# Patient Record
Sex: Male | Born: 1980 | Race: White | Hispanic: No | Marital: Single | Smoking: Current every day smoker
Health system: Southern US, Community
[De-identification: ages and names within clinical notes are randomized; demographics above are authoritative.]

## PROBLEM LIST (undated history)

## (undated) DIAGNOSIS — J939 Pneumothorax, unspecified: Secondary | ICD-10-CM

## (undated) HISTORY — PX: CLAVICLE SURGERY: SHX598

## (undated) SURGERY — Surgical Case
Anesthesia: *Unknown

---

## 2012-12-29 DIAGNOSIS — J939 Pneumothorax, unspecified: Secondary | ICD-10-CM

## 2012-12-29 HISTORY — PX: ORTHOPEDIC SURGERY: SHX850

## 2012-12-29 HISTORY — DX: Pneumothorax, unspecified: J93.9

## 2014-03-14 ENCOUNTER — Encounter (HOSPITAL_COMMUNITY): Payer: Self-pay | Admitting: Emergency Medicine

## 2014-03-14 ENCOUNTER — Emergency Department (HOSPITAL_COMMUNITY)

## 2014-03-14 ENCOUNTER — Emergency Department (HOSPITAL_COMMUNITY)
Admission: EM | Admit: 2014-03-14 | Discharge: 2014-03-15 | Disposition: A | Discharge status: Home / Self Care | Attending: Emergency Medicine | Admitting: Emergency Medicine

## 2014-03-14 DIAGNOSIS — X500XXA Overexertion from strenuous movement or load, initial encounter: Secondary | ICD-10-CM | POA: Insufficient documentation

## 2014-03-14 DIAGNOSIS — R110 Nausea: Secondary | ICD-10-CM | POA: Insufficient documentation

## 2014-03-14 DIAGNOSIS — Z8781 Personal history of (healed) traumatic fracture: Secondary | ICD-10-CM | POA: Insufficient documentation

## 2014-03-14 DIAGNOSIS — S76219A Strain of adductor muscle, fascia and tendon of unspecified thigh, initial encounter: Secondary | ICD-10-CM

## 2014-03-14 DIAGNOSIS — Y93B9 Activity, other involving muscle strengthening exercises: Secondary | ICD-10-CM | POA: Insufficient documentation

## 2014-03-14 DIAGNOSIS — Y929 Unspecified place or not applicable: Secondary | ICD-10-CM | POA: Insufficient documentation

## 2014-03-14 DIAGNOSIS — IMO0002 Reserved for concepts with insufficient information to code with codable children: Secondary | ICD-10-CM | POA: Insufficient documentation

## 2014-03-14 DIAGNOSIS — Z87891 Personal history of nicotine dependence: Secondary | ICD-10-CM | POA: Insufficient documentation

## 2014-03-14 LAB — CBC WITH DIFFERENTIAL/PLATELET
Basophils Absolute: 0 10*3/uL (ref 0.0–0.1)
Basophils Relative: 0 % (ref 0–1)
EOS PCT: 1 % (ref 0–5)
Eosinophils Absolute: 0.1 10*3/uL (ref 0.0–0.7)
HCT: 37.5 % — ABNORMAL LOW (ref 39.0–52.0)
Hemoglobin: 13.2 g/dL (ref 13.0–17.0)
LYMPHS ABS: 1.5 10*3/uL (ref 0.7–4.0)
Lymphocytes Relative: 28 % (ref 12–46)
MCH: 30.4 pg (ref 26.0–34.0)
MCHC: 35.2 g/dL (ref 30.0–36.0)
MCV: 86.4 fL (ref 78.0–100.0)
MONOS PCT: 11 % (ref 3–12)
Monocytes Absolute: 0.6 10*3/uL (ref 0.1–1.0)
Neutro Abs: 3.2 10*3/uL (ref 1.7–7.7)
Neutrophils Relative %: 60 % (ref 43–77)
Platelets: 161 10*3/uL (ref 150–400)
RBC: 4.34 MIL/uL (ref 4.22–5.81)
RDW: 11.8 % (ref 11.5–15.5)
WBC: 5.4 10*3/uL (ref 4.0–10.5)
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
## 2014-03-14 NOTE — ED Notes (Signed)
Pt reports he was working out yesterday and he did feel a pulling/sharp pain to his right lower abd and into right groin.  Pt states the pain is worse today, worse with movement

## 2014-03-14 NOTE — ED Notes (Signed)
Pt states was working out yesterday & felt pain in lower abdominal. Pt states today he has a discoloration in his groin today.

## 2014-03-15 ENCOUNTER — Encounter (HOSPITAL_COMMUNITY): Payer: Self-pay | Admitting: Radiology

## 2014-03-15 LAB — COMPREHENSIVE METABOLIC PANEL
ALT: 40 U/L (ref 0–53)
AST: 32 U/L (ref 0–37)
Albumin: 4.6 g/dL (ref 3.5–5.2)
Alkaline Phosphatase: 69 U/L (ref 39–117)
BILIRUBIN TOTAL: 1 mg/dL (ref 0.3–1.2)
BUN: 13 mg/dL (ref 6–23)
CALCIUM: 9.8 mg/dL (ref 8.4–10.5)
CO2: 26 mEq/L (ref 19–32)
Chloride: 103 mEq/L (ref 96–112)
Creatinine, Ser: 0.76 mg/dL (ref 0.50–1.35)
Glucose, Bld: 110 mg/dL — ABNORMAL HIGH (ref 70–99)
Potassium: 4 mEq/L (ref 3.7–5.3)
Sodium: 141 mEq/L (ref 137–147)
Total Protein: 8 g/dL (ref 6.0–8.3)

## 2014-03-15 MED ORDER — TRAMADOL HCL 50 MG PO TABS: 50.0000 mg | ORAL_TABLET | Freq: Four times a day (QID) | ORAL | Status: DC | PRN

## 2014-03-15 MED ORDER — IOHEXOL 300 MG/ML  SOLN
50.0000 mL | Freq: Once | INTRAMUSCULAR | Status: AC | PRN
  Administered 2014-03-15: 50 mL via ORAL

## 2014-03-15 MED ORDER — IOHEXOL 300 MG/ML  SOLN
100.0000 mL | Freq: Once | INTRAMUSCULAR | Status: AC | PRN
  Administered 2014-03-15: 100 mL via INTRAVENOUS

## 2014-03-15 MED ORDER — TRAMADOL HCL 50 MG PO TABS
ORAL_TABLET | ORAL | Status: AC
  Filled 2014-03-15: qty 1

## 2014-03-15 MED ORDER — TRAMADOL HCL 50 MG PO TABS
50.0000 mg | ORAL_TABLET | Freq: Once | ORAL | Status: AC
  Administered 2014-03-15: 50 mg via ORAL

## 2014-03-15 NOTE — Discharge Instructions (Signed)
Tramadol as needed for pain.    Return to the ER for increased pain, vomiting, or other new and concerning symptoms.   Groin Strain A groin strain (also called a groin pull) is an injury to the muscles or tendon on the upper inner part of the thigh. These muscles are called the adductor muscles or groin muscles. They are responsible for moving the leg across the body. A muscle strain occurs when a muscle is overstretched and some muscle fibers are torn. A groin strain can range from mild to severe depending on how many muscle fibers are affected and whether the muscle fibers are partially or completely torn.  Groin strains usually occur during exercise or participation in sports. The injury often happens when a sudden, violent force is placed on a muscle, stretching the muscle too far. A strain is more likely to occur when your muscles are not warmed up or if you are not properly conditioned. Depending on the severity of the groin strain, recovery time may vary from a few weeks to several weeks. Severe injuries often require 4 6 weeks for recovery. In these cases, complete healing can take 4 5 months.  CAUSES   Stretching the groin muscles too far or too suddenly, often during side-to-side motion with an abrupt change in direction.  Putting repeated stress on the groin muscles over a long period of time.  Performing vigorous activity without properly stretching the groin muscles beforehand. SYMPTOMS   Pain and tenderness in the groin area. This begins as sharp pain and persists as a dull ache.  Popping or snapping feeling when the injury occurs (for severe strains).  Swelling or bruising.  Muscle spasms.  Weakness in the leg.  Stiffness in the groin area with decreased ability to move the affected muscles. DIAGNOSIS  Your caregiver will perform a physical exam to diagnose a groin strain. You will be asked about your symptoms and how the injury occurred. X-rays are sometimes needed to  rule out a broken bone or cartilage problems. Your caregiver may order a CT scan or MRI if a complete muscle tear is suspected. TREATMENT  A groin strain will often heal on its own. Your caregiver may prescribe medicines to help manage pain and swelling (anti-inflammatory medicine). You may be told to use crutches for the first few days to minimize your pain. HOME CARE INSTRUCTIONS   Rest. Do not use the strained muscle if it causes pain.  Put ice on the injured area.  Put ice in a plastic bag.  Place a towel between your skin and the bag.  Leave the ice on for 15 20 minutes, every 2 3 hours. Do this for the first 2 days after the injury.  Only take over-the-counter or prescription medicines as directed by your caregiver.  Wrap the injured area with an elastic bandage as directed by your caregiver.  Keep the injured leg raised (elevated).  Walk, stretch, and perform range-of-motion exercises to improve blood flow to the injured area. Only perform these activities if you can do so without any pain. To prevent muscle strains:  Warm up before exercise.  Develop proper conditioning and strength in the groin muscles. SEEK IMMEDIATE MEDICAL CARE IF:   You have increased pain or swelling in the affected area.   Your symptoms are not improving or are getting worse. MAKE SURE YOU:   Understand these instructions.  Will watch your condition.  Will get help right away if you are not doing well or  get worse. Document Released: 08/12/2004 Document Revised: 12/01/2012 Document Reviewed: 08/18/2012 [facility] Patient Information 2014 [place], [place].

## 2015-09-01 ENCOUNTER — Inpatient Hospital Stay (HOSPITAL_COMMUNITY)
Admission: RE | Admit: 2015-09-01 | Discharge: 2015-09-11 | DRG: 897 | Disposition: A | Discharge status: Home / Self Care | Payer: Medicaid Other | Source: Intra-hospital | Attending: Psychiatry | Admitting: Psychiatry

## 2015-09-01 ENCOUNTER — Emergency Department (HOSPITAL_COMMUNITY)
Admission: EM | Admit: 2015-09-01 | Discharge: 2015-09-01 | Disposition: A | Discharge status: Other Acute Inpatient Hospital | Payer: Medicaid Other | Attending: Emergency Medicine | Admitting: Emergency Medicine

## 2015-09-01 ENCOUNTER — Encounter (HOSPITAL_COMMUNITY): Payer: Self-pay | Admitting: Emergency Medicine

## 2015-09-01 DIAGNOSIS — F17210 Nicotine dependence, cigarettes, uncomplicated: Secondary | ICD-10-CM | POA: Diagnosis present

## 2015-09-01 DIAGNOSIS — R45851 Suicidal ideations: Secondary | ICD-10-CM | POA: Diagnosis present

## 2015-09-01 DIAGNOSIS — F1020 Alcohol dependence, uncomplicated: Principal | ICD-10-CM | POA: Diagnosis present

## 2015-09-01 DIAGNOSIS — F1099 Alcohol use, unspecified with unspecified alcohol-induced disorder: Secondary | ICD-10-CM | POA: Diagnosis not present

## 2015-09-01 DIAGNOSIS — F1410 Cocaine abuse, uncomplicated: Secondary | ICD-10-CM | POA: Diagnosis not present

## 2015-09-01 DIAGNOSIS — F1420 Cocaine dependence, uncomplicated: Secondary | ICD-10-CM | POA: Diagnosis present

## 2015-09-01 DIAGNOSIS — G4700 Insomnia, unspecified: Secondary | ICD-10-CM | POA: Diagnosis present

## 2015-09-01 DIAGNOSIS — F419 Anxiety disorder, unspecified: Secondary | ICD-10-CM | POA: Diagnosis present

## 2015-09-01 DIAGNOSIS — Z8709 Personal history of other diseases of the respiratory system: Secondary | ICD-10-CM | POA: Diagnosis not present

## 2015-09-01 DIAGNOSIS — Z720 Tobacco use: Secondary | ICD-10-CM | POA: Insufficient documentation

## 2015-09-01 DIAGNOSIS — F332 Major depressive disorder, recurrent severe without psychotic features: Secondary | ICD-10-CM | POA: Diagnosis present

## 2015-09-01 DIAGNOSIS — Y906 Blood alcohol level of 120-199 mg/100 ml: Secondary | ICD-10-CM | POA: Diagnosis present

## 2015-09-01 HISTORY — DX: Pneumothorax, unspecified: J93.9

## 2015-09-01 LAB — COMPREHENSIVE METABOLIC PANEL
ALT: 16 U/L — ABNORMAL LOW (ref 17–63)
ANION GAP: 10 (ref 5–15)
AST: 23 U/L (ref 15–41)
Albumin: 5 g/dL (ref 3.5–5.0)
Alkaline Phosphatase: 53 U/L (ref 38–126)
BUN: 9 mg/dL (ref 6–20)
CHLORIDE: 104 mmol/L (ref 101–111)
CO2: 22 mmol/L (ref 22–32)
Calcium: 9.4 mg/dL (ref 8.9–10.3)
Creatinine, Ser: 0.69 mg/dL (ref 0.61–1.24)
GFR calc Af Amer: >60 mL/min (ref >60)
GFR calc non Af Amer: >60 mL/min (ref >60)
Glucose, Bld: 97 mg/dL (ref 65–99)
POTASSIUM: 3.3 mmol/L — AB (ref 3.5–5.1)
Sodium: 136 mmol/L (ref 135–145)
Total Bilirubin: 1.1 mg/dL (ref 0.3–1.2)
Total Protein: 8.1 g/dL (ref 6.5–8.1)

## 2015-09-01 LAB — CBC
HCT: 43.5 % (ref 39.0–52.0)
Hemoglobin: 15.3 g/dL (ref 13.0–17.0)
MCH: 32.8 pg (ref 26.0–34.0)
MCHC: 35.2 g/dL (ref 30.0–36.0)
MCV: 93.1 fL (ref 78.0–100.0)
Platelets: 152 10*3/uL (ref 150–400)
RBC: 4.67 MIL/uL (ref 4.22–5.81)
RDW: 12.5 % (ref 11.5–15.5)
WBC: 11 10*3/uL — AB (ref 4.0–10.5)

## 2015-09-01 LAB — RAPID URINE DRUG SCREEN, HOSP PERFORMED
AMPHETAMINES: NOT DETECTED
BENZODIAZEPINES: NOT DETECTED
Barbiturates: NOT DETECTED
COCAINE: POSITIVE — AB
Opiates: NOT DETECTED
Tetrahydrocannabinol: NOT DETECTED

## 2015-09-01 LAB — SALICYLATE LEVEL: Salicylate Lvl: <4 mg/dL (ref 2.8–30.0)

## 2015-09-01 LAB — ETHANOL: ALCOHOL ETHYL (B): 120 mg/dL — AB (ref <5)

## 2015-09-01 LAB — ACETAMINOPHEN LEVEL

## 2015-09-01 MED ORDER — ADULT MULTIVITAMIN W/MINERALS CH
1.0000 | ORAL_TABLET | Freq: Every day | ORAL | Status: DC
  Administered 2015-09-01: 1 via ORAL
  Filled 2015-09-01: qty 1

## 2015-09-01 MED ORDER — FLUOXETINE HCL 10 MG PO CAPS
10.0000 mg | ORAL_CAPSULE | Freq: Every day | ORAL | Status: DC
  Administered 2015-09-01: 10 mg via ORAL
  Filled 2015-09-01: qty 1

## 2015-09-01 MED ORDER — NICOTINE 21 MG/24HR TD PT24
21.0000 mg | MEDICATED_PATCH | Freq: Every day | TRANSDERMAL | Status: DC
  Filled 2015-09-01: qty 1

## 2015-09-01 MED ORDER — CHLORDIAZEPOXIDE HCL 25 MG PO CAPS
25.0000 mg | ORAL_CAPSULE | Freq: Four times a day (QID) | ORAL | Status: AC
  Administered 2015-09-01 – 2015-09-02 (×4): 25 mg via ORAL
  Filled 2015-09-01 (×4): qty 1

## 2015-09-01 MED ORDER — CHLORDIAZEPOXIDE HCL 25 MG PO CAPS: 25.0000 mg | ORAL_CAPSULE | ORAL | Status: DC

## 2015-09-01 MED ORDER — ADULT MULTIVITAMIN W/MINERALS CH
1.0000 | ORAL_TABLET | Freq: Every day | ORAL | Status: DC
Start: 2015-09-02 — End: 2015-09-11
  Administered 2015-09-02 – 2015-09-11 (×10): 1 via ORAL
  Filled 2015-09-01 (×11): qty 1

## 2015-09-01 MED ORDER — HYDROXYZINE HCL 25 MG PO TABS: 25.0000 mg | ORAL_TABLET | Freq: Four times a day (QID) | ORAL | Status: DC | PRN

## 2015-09-01 MED ORDER — ONDANSETRON 4 MG PO TBDP: 4.0000 mg | ORAL_TABLET | Freq: Four times a day (QID) | ORAL | Status: AC | PRN

## 2015-09-01 MED ORDER — NICOTINE 21 MG/24HR TD PT24
21.0000 mg | MEDICATED_PATCH | Freq: Every day | TRANSDERMAL | Status: DC
  Administered 2015-09-01: 21 mg via TRANSDERMAL
  Filled 2015-09-01: qty 1

## 2015-09-01 MED ORDER — VITAMIN B-1 100 MG PO TABS: 100.0000 mg | ORAL_TABLET | Freq: Every day | ORAL | Status: DC

## 2015-09-01 MED ORDER — VITAMIN B-1 100 MG PO TABS
100.0000 mg | ORAL_TABLET | Freq: Every day | ORAL | Status: DC
  Administered 2015-09-02 – 2015-09-11 (×10): 100 mg via ORAL
  Filled 2015-09-01 (×11): qty 1

## 2015-09-01 MED ORDER — ACETAMINOPHEN 325 MG PO TABS: 650.0000 mg | ORAL_TABLET | Freq: Four times a day (QID) | ORAL | Status: DC | PRN

## 2015-09-01 MED ORDER — CHLORDIAZEPOXIDE HCL 25 MG PO CAPS
25.0000 mg | ORAL_CAPSULE | Freq: Four times a day (QID) | ORAL | Status: DC
  Administered 2015-09-01 (×2): 25 mg via ORAL
  Filled 2015-09-01 (×2): qty 1

## 2015-09-01 MED ORDER — CHLORDIAZEPOXIDE HCL 25 MG PO CAPS
25.0000 mg | ORAL_CAPSULE | ORAL | Status: AC
  Administered 2015-09-04 (×2): 25 mg via ORAL
  Filled 2015-09-01 (×2): qty 1

## 2015-09-01 MED ORDER — CHLORDIAZEPOXIDE HCL 25 MG PO CAPS: 25.0000 mg | ORAL_CAPSULE | Freq: Four times a day (QID) | ORAL | Status: DC | PRN

## 2015-09-01 MED ORDER — CHLORDIAZEPOXIDE HCL 25 MG PO CAPS
25.0000 mg | ORAL_CAPSULE | Freq: Every day | ORAL | Status: AC
  Administered 2015-09-05: 25 mg via ORAL
  Filled 2015-09-01: qty 1

## 2015-09-01 MED ORDER — POTASSIUM CHLORIDE CRYS ER 20 MEQ PO TBCR: 40.0000 meq | EXTENDED_RELEASE_TABLET | Freq: Once | ORAL | Status: DC

## 2015-09-01 MED ORDER — THIAMINE HCL 100 MG/ML IJ SOLN
100.0000 mg | Freq: Once | INTRAMUSCULAR | Status: AC
  Administered 2015-09-01: 100 mg via INTRAMUSCULAR
  Filled 2015-09-01: qty 2

## 2015-09-01 MED ORDER — CHLORDIAZEPOXIDE HCL 25 MG PO CAPS: 25.0000 mg | ORAL_CAPSULE | Freq: Every day | ORAL | Status: DC

## 2015-09-01 MED ORDER — HYDROXYZINE HCL 25 MG PO TABS
25.0000 mg | ORAL_TABLET | Freq: Four times a day (QID) | ORAL | Status: AC | PRN
  Administered 2015-09-02: 25 mg via ORAL
  Filled 2015-09-01: qty 1

## 2015-09-01 MED ORDER — CHLORDIAZEPOXIDE HCL 25 MG PO CAPS: 25.0000 mg | ORAL_CAPSULE | Freq: Three times a day (TID) | ORAL | Status: DC

## 2015-09-01 MED ORDER — CHLORDIAZEPOXIDE HCL 25 MG PO CAPS: 25.0000 mg | ORAL_CAPSULE | Freq: Four times a day (QID) | ORAL | Status: AC | PRN

## 2015-09-01 MED ORDER — LOPERAMIDE HCL 2 MG PO CAPS: 2.0000 mg | ORAL_CAPSULE | ORAL | Status: AC | PRN

## 2015-09-01 MED ORDER — FLUOXETINE HCL 10 MG PO CAPS
10.0000 mg | ORAL_CAPSULE | Freq: Every day | ORAL | Status: DC
  Administered 2015-09-02 – 2015-09-04 (×3): 10 mg via ORAL
  Filled 2015-09-01 (×4): qty 1

## 2015-09-01 MED ORDER — CHLORDIAZEPOXIDE HCL 25 MG PO CAPS
25.0000 mg | ORAL_CAPSULE | Freq: Three times a day (TID) | ORAL | Status: AC
  Administered 2015-09-03 (×3): 25 mg via ORAL
  Filled 2015-09-01 (×3): qty 1

## 2015-09-01 MED ORDER — TRAZODONE HCL 50 MG PO TABS: 50.0000 mg | ORAL_TABLET | Freq: Every evening | ORAL | Status: DC | PRN

## 2015-09-01 MED ORDER — TRAZODONE HCL 50 MG PO TABS
50.0000 mg | ORAL_TABLET | Freq: Every evening | ORAL | Status: DC | PRN
  Administered 2015-09-01 – 2015-09-03 (×3): 50 mg via ORAL
  Filled 2015-09-01 (×3): qty 1

## 2015-09-01 MED ORDER — LOPERAMIDE HCL 2 MG PO CAPS: 2.0000 mg | ORAL_CAPSULE | ORAL | Status: DC | PRN

## 2015-09-01 MED ORDER — MAGNESIUM HYDROXIDE 400 MG/5ML PO SUSP: 30.0000 mL | Freq: Every day | ORAL | Status: DC | PRN

## 2015-09-01 MED ORDER — ONDANSETRON 4 MG PO TBDP: 4.0000 mg | ORAL_TABLET | Freq: Four times a day (QID) | ORAL | Status: DC | PRN

## 2015-09-01 MED ORDER — ALUM & MAG HYDROXIDE-SIMETH 200-200-20 MG/5ML PO SUSP: 30.0000 mL | ORAL | Status: DC | PRN

## 2015-09-01 MED ORDER — POTASSIUM CHLORIDE CRYS ER 20 MEQ PO TBCR
40.0000 meq | EXTENDED_RELEASE_TABLET | Freq: Once | ORAL | Status: AC
  Administered 2015-09-01: 40 meq via ORAL
  Filled 2015-09-01: qty 2

## 2015-09-01 NOTE — Consult Note (Signed)
[facility] Consult   Reason for Consult: Alcohol use disorder, severe Referring Physician:  EDP Patient Identification: Rodney Fernandez MRN:  [id number] Principal Diagnosis: Alcohol use disorder, severe, dependence Diagnosis:   Patient Active Problem List   Diagnosis Date Noted  . Severe recurrent major depression without psychotic features [F33.2] 09/01/2015  . Alcohol use disorder, severe, dependence [F10.20] 09/01/2015    Total Time spent with patient: 1 hour  Subjective:   Rodney Fernandez is a 34 y.o. male patient admitted with Alcohol use disorder, severe.  HPI:  Caucasian male, 34 years old was evaluated for Alcohol use.  Patient reports drinking 12-18 beers a day.  Patient reports that her drinking has cost him his marriage, his child and his job.  He was brought in by [facility] who reported that patient had threatened to kill himself if he did not receive treatment for Alcoholism.  Patient states that he started drinking Alcohol at age 8 and stated that he has been drinking heavily since then.  He reports 2 years of sobriety but went back drinking heavily again.  Patient reports previous suicide attempt by OD 10 years ago when his first wife left him.  Patient admitted to a previous diagnosis of depression, PTSD and was placed on medications which he did not take.  Patient denies SI/HI/AVH.  He reports withdrawal symptoms of nausea, shakes and anxiety.  He is accepted and we will be seeking placement for Alcohol detox and treatment of depression.  HPI Elements:   Location:  Alcohol use disorder, Recurrent depression without Psychosis. Quality:  severe, endorsing suicide. Severity:  severe. Timing:  acute. Duration:  since age 80. Context:  Seeking treatment for Alcoholism and depression..  Past Medical History:  Past Medical History  Diagnosis Date  . Pneumothorax 2014    Past Surgical History  Procedure Laterality Date  . Orthopedic surgery  2014    Metal placement  in rght hand and left shoulder   Family History: No family history on file. Social History:  History  Alcohol Use  . Yes     History  Drug Use  . Yes  . Special: Cocaine, Marijuana    Social History   Social History  . Marital Status: Single    Spouse Name: N/A  . Number of Children: N/A  . Years of Education: N/A   Social History Main Topics  . Smoking status: Current Every Day Smoker  . Smokeless tobacco: None  . Alcohol Use: Yes  . Drug Use: Yes    Special: Cocaine, Marijuana  . Sexual Activity: Not Asked   Other Topics Concern  . None   Social History Narrative   Additional Social History:    Pain Medications: See PTA Prescriptions: See PTA Over the Counter: See PTA History of alcohol / drug use?: Yes Longest period of sobriety (when/how long): 2 years Negative Consequences of Use: Financial Name of Substance 1: Alcohol 1 - Age of First Use: 16 1 - Amount (size/oz): 12-18 beers 1 - Frequency: daily 1 - Duration: ongoing 1 - Last Use / Amount: 09/01/2015                   Allergies:   Allergies  Allergen Reactions  . Haloperidol And Related Other (See Comments)    Joints lock up    Labs:  Results for orders placed or performed during the hospital encounter of 09/01/15 (from the past 48 hour(s))  Urine rapid drug screen (hosp performed) (Not at [facility])  Status: Abnormal   Collection Time: 09/01/15  5:43 AM  Result Value Ref Range   Opiates NONE DETECTED NONE DETECTED   Cocaine POSITIVE (A) NONE DETECTED   Benzodiazepines NONE DETECTED NONE DETECTED   Amphetamines NONE DETECTED NONE DETECTED   Tetrahydrocannabinol NONE DETECTED NONE DETECTED   Barbiturates NONE DETECTED NONE DETECTED    Comment:        DRUG SCREEN FOR MEDICAL PURPOSES ONLY.  IF CONFIRMATION IS NEEDED FOR ANY PURPOSE, NOTIFY LAB WITHIN 5 DAYS.        LOWEST DETECTABLE LIMITS FOR URINE DRUG SCREEN Drug Class       Cutoff (ng/mL) Amphetamine      1000 Barbiturate       200 Benzodiazepine   409 Tricyclics       735 Opiates          300 Cocaine          300 THC              50   Comprehensive metabolic panel     Status: Abnormal   Collection Time: 09/01/15  5:52 AM  Result Value Ref Range   Sodium 136 135 - 145 mmol/L   Potassium 3.3 (L) 3.5 - 5.1 mmol/L   Chloride 104 101 - 111 mmol/L   CO2 22 22 - 32 mmol/L   Glucose, Bld 97 65 - 99 mg/dL   BUN 9 6 - 20 mg/dL   Creatinine, Ser 0.69 0.61 - 1.24 mg/dL   Calcium 9.4 8.9 - 10.3 mg/dL   Total Protein 8.1 6.5 - 8.1 g/dL   Albumin 5.0 3.5 - 5.0 g/dL   AST 23 15 - 41 U/L   ALT 16 (L) 17 - 63 U/L   Alkaline Phosphatase 53 38 - 126 U/L   Total Bilirubin 1.1 0.3 - 1.2 mg/dL   GFR calc non Af Amer >60 >60 mL/min   GFR calc Af Amer >60 >60 mL/min    Comment: (NOTE) The eGFR has been calculated using the CKD EPI equation. This calculation has not been validated in all clinical situations. eGFR's persistently <60 mL/min signify possible Chronic Kidney Disease.    Anion gap 10 5 - 15  CBC     Status: Abnormal   Collection Time: 09/01/15  5:52 AM  Result Value Ref Range   WBC 11.0 (H) 4.0 - 10.5 K/uL   RBC 4.67 4.22 - 5.81 MIL/uL   Hemoglobin 15.3 13.0 - 17.0 g/dL   HCT 43.5 39.0 - 52.0 %   MCV 93.1 78.0 - 100.0 fL   MCH 32.8 26.0 - 34.0 pg   MCHC 35.2 30.0 - 36.0 g/dL   RDW 12.5 11.5 - 15.5 %   Platelets 152 150 - 400 K/uL  Ethanol (ETOH)     Status: Abnormal   Collection Time: 09/01/15  5:53 AM  Result Value Ref Range   Alcohol, Ethyl (B) 120 (H) <5 mg/dL    Comment:        LOWEST DETECTABLE LIMIT FOR SERUM ALCOHOL IS 5 mg/dL FOR MEDICAL PURPOSES ONLY   Salicylate level     Status: None   Collection Time: 09/01/15  5:53 AM  Result Value Ref Range   Salicylate Lvl <3.2 2.8 - 30.0 mg/dL  Acetaminophen level     Status: Abnormal   Collection Time: 09/01/15  5:53 AM  Result Value Ref Range   Acetaminophen (Tylenol), Serum <10 (L) 10 - 30 ug/mL    Comment:  THERAPEUTIC  CONCENTRATIONS VARY SIGNIFICANTLY. A RANGE OF 10-30 ug/mL MAY BE AN EFFECTIVE CONCENTRATION FOR MANY PATIENTS. HOWEVER, SOME ARE BEST TREATED AT CONCENTRATIONS OUTSIDE THIS RANGE. ACETAMINOPHEN CONCENTRATIONS >150 ug/mL AT 4 HOURS AFTER INGESTION AND >50 ug/mL AT 12 HOURS AFTER INGESTION ARE OFTEN ASSOCIATED WITH TOXIC REACTIONS.     Vitals: Blood pressure 120/75, pulse 78, temperature 98.2 F (36.8 C), temperature source Oral, resp. rate 18, SpO2 98 %.  Risk to Self: Suicidal Ideation: No Suicidal Intent: No Is patient at risk for suicide?: No Suicidal Plan?: No Access to Means: Yes Specify Access to Suicidal Means: Knives What has been your use of drugs/alcohol within the last 12 months?: uses alcohol and some drug use How many times?: 3 Other Self Harm Risks: Cutting Triggers for Past Attempts: Other (Comment) Intentional Self Injurious Behavior:  ("everything") Risk to Others: Homicidal Ideation: No Thoughts of Harm to Others: No Current Homicidal Intent: No Current Homicidal Plan: No Access to Homicidal Means: No Identified Victim: Denies History of harm to others?: No Assessment of Violence: None Noted Violent Behavior Description: Denies Does patient have access to weapons?: No Criminal Charges Pending?: No Does patient have a court date: No Prior Inpatient Therapy: Prior Inpatient Therapy: Yes Prior Therapy Dates: 10-12 years ago Prior Therapy Facilty/Provider(s): [facility] Reason for Treatment: SA/Depression Prior Outpatient Therapy: Prior Outpatient Therapy: Yes Prior Therapy Dates: 10-12 years ago Prior Therapy Facilty/Provider(s): [facility] Reason for Treatment: Sa/Depression Does patient have an ACCT team?: No Does patient have Intensive In-House Services?  : No Does patient have [facility] services? : No Does patient have P4CC services?: No  Current Facility-Administered Medications  Medication Dose Route Frequency Provider Last Rate Last Dose  .  chlordiazePOXIDE (LIBRIUM) capsule 25 mg  25 mg Oral Q6H PRN Kamera Dubas      . chlordiazePOXIDE (LIBRIUM) capsule 25 mg  25 mg Oral QID Lyndon Chapel   25 mg at 09/01/15 1255   Followed by  . [START ON 09/02/2015] chlordiazePOXIDE (LIBRIUM) capsule 25 mg  25 mg Oral TID Jameah Rouser       Followed by  . [START ON 09/03/2015] chlordiazePOXIDE (LIBRIUM) capsule 25 mg  25 mg Oral BH-qamhs Riddick Nuon       Followed by  . [START ON 09/04/2015] chlordiazePOXIDE (LIBRIUM) capsule 25 mg  25 mg Oral Daily Burnham Trost      . FLUoxetine (PROZAC) capsule 10 mg  10 mg Oral Daily Jackelyn Illingworth   10 mg at 09/01/15 1255  . hydrOXYzine (ATARAX/VISTARIL) tablet 25 mg  25 mg Oral Q6H PRN Jadore Veals      . loperamide (IMODIUM) capsule 2-4 mg  2-4 mg Oral PRN Christel Bai      . multivitamin with minerals tablet 1 tablet  1 tablet Oral Daily Sherron Mummert   1 tablet at 09/01/15 1255  . nicotine (NICODERM CQ - dosed in mg/24 hours) patch 21 mg  21 mg Transdermal Daily Hannah Muthersbaugh, PA-C      . ondansetron (ZOFRAN-ODT) disintegrating tablet 4 mg  4 mg Oral Q6H PRN Nicoletta Hush      . [START ON 09/02/2015] thiamine (VITAMIN B-1) tablet 100 mg  100 mg Oral Daily Ariellah Faust      . traZODone (DESYREL) tablet 50 mg  50 mg Oral QHS PRN Lawanna Cecere       No current outpatient prescriptions on file.    Musculoskeletal: Strength & Muscle Tone: within normal limits Gait & Station: normal Patient leans: N/A  Psychiatric Specialty Exam: Physical Exam  ROS  Blood pressure 120/75, pulse 78, temperature 98.2 F (36.8 C), temperature source Oral, resp. rate 18, SpO2 98 %.There is no weight on file to calculate BMI.  General Appearance: Casual  Eye Contact::  Minimal  Speech:  Clear and Coherent and Normal Rate  Volume:  Normal  Mood:  Angry, Anxious, Depressed and Hopeless  Affect:  Congruent and Depressed  Thought Process:  Coherent, Goal Directed and Intact  Orientation:   Full (Time, Place, and Person)  Thought Content:  WDL  Suicidal Thoughts:  No  Homicidal Thoughts:  No  Memory:  Immediate;   Good Recent;   Good Remote;   Good  Judgement:  Impaired  Insight:  Shallow  Psychomotor Activity:  Tremor  Concentration:  Good  Recall:  NA  Fund of Knowledge:Good  Language: Good  Akathisia:  NA  Handed:  Right  AIMS (if indicated):     Assets:  Desire for Improvement  ADL's:  Intact  Cognition: WNL  Sleep:      Medical Decision Making: Review of Psycho-Social Stressors (1)  Treatment Plan Summary: Daily contact with patient to assess and evaluate symptoms and progress in treatment and Medication management  Plan:  We are using our Librium protocol for his Alcohol detox treatment, We have restarted his Prozac 10 mg po daily for depression, Vistaril 25 mg po every 6 hours as needed for anxiety. Disposition: Admit and seek placement.  Delfin Gant   PMHNP-BC 09/01/2015 3:30 PM Patient seen face-to-face for psychiatric evaluation, chart reviewed and case discussed with the physician extender and developed treatment plan. Reviewed the information documented and agree with the treatment plan. Corena Pilgrim, MD

## 2015-09-01 NOTE — ED Notes (Signed)
Pt. To [facility] from ED ambulatory without difficulty, to room 36 . Report from [employer]. Pt. Is alert and oriented, warm and dry in no distress. Pt. Denies SI, HI, and AVH. Pt. Calm and cooperative. Pt. Made aware of security cameras and Q15 minute rounds. Pt. Encouraged to let Nursing staff know of any concerns or needs.

## 2015-09-01 NOTE — ED Notes (Signed)
Up to the bathroom 

## 2015-09-01 NOTE — ED Notes (Signed)
Report received from Janie Rambo RN. Pt. Sleeping, respirations regular and unlabored. Will continue to monitor for safety via security cameras and Q 15 minute checks. 

## 2015-09-01 NOTE — BH Assessment (Addendum)
Assessment Note  Rodney Fernandez is an 34 y.o. male who presents to [facility] voluntarily for SI and alcohol intoxication. Patient states that he is here for "drinking and thoughts of cutting myself."  Patient states that he is currently homeless and living in a hotel and is separated from his wife. Patient states that he has been drinking alcohol since the age of 56 and drinks 12-18 beers per day. Patient drank 3 40 ounce beers before coming into the ED. Patient states that he uses drugs "but it's not enough to matter" and declined to elaborate. Patients ETOH was 120 and UDS positive for cocaine at time of arrival. . Patient states that he was admitted inpatient "ten to twelve years ago" at [facility] for "depression and drinking" and denies any other treatment since then. Patient states that he is not prescribed any medications for depression at this time. Patient endorses triggers for feeling suicidal as "everything" and declined to go into further detail. Patient states that he last attempted suicide "six to eight years ago" and has cut himself intentionally in the past year. Patient states that he is tired during the day, he isolates himself from others, sleeps 3-4 hours per night and feels worthless at times. Patient denies SI/HI and psychosis at the time of the assessment.   Patient was alert and oriented to person and place. When asked the date patient states that it is July 02, 2015. Patient made poor eye contact as he was facing the wall under his blanket for the majority of the assessment. Patient was calm and cooperative and his speech appeared logical and coherent but slurred at times. Patient appeared to be sad and his mood and affect are congruent. Patient denies psychosis and history of psychosis and does not appear to be responding to internal stimuli. Patient denies a history of violence towards others as well as a history of trauma/abuse.   Disposition to be determined by Psychiatric Team.    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 Pneumothorax 2014   Axis IV: economic problems, housing problems, other psychosocial or environmental problems, problems related to social environment and problems with primary support group Axis V: 51-60 moderate symptoms  Past Medical History:  Past Medical History  Diagnosis Date  . Pneumothorax 2014    Past Surgical History  Procedure Laterality Date  . Orthopedic surgery  2014    Metal placement in rght hand and left shoulder    Family History: No family history on file.  Social History:  reports that he has been smoking.  He does not have any smokeless tobacco history on file. He reports that he drinks alcohol. He reports that he uses illicit drugs (Cocaine and Marijuana).  Additional Social History:  Alcohol / Drug Use Pain Medications: See PTA Prescriptions: See PTA Over the Counter: See PTA History of alcohol / drug use?: Yes Longest period of sobriety (when/how long): 2 years Negative Consequences of Use: Financial Substance #1 Name of Substance 1: Alcohol 1 - Age of First Use: 16 1 - Amount (size/oz): 12-18 beers 1 - Frequency: daily 1 - Duration: ongoing 1 - Last Use / Amount: 09/01/2015  CIWA: CIWA-Ar BP: 118/75 mmHg Pulse Rate: 90 COWS:    Allergies:  Allergies  Allergen Reactions  . Haloperidol And Related Other (See Comments)    Joints lock up    Home Medications:  (Not in a  hospital admission)  OB/GYN Status:  No LMP for male patient.  General Assessment Data Location of Assessment: [facility] TTS Assessment: In system Is this a Tele or Face-to-Face Assessment?: Face-to-Face Is this an Initial Assessment or a Re-assessment for this encounter?: Initial Assessment Marital status: Separated Is patient pregnant?: No Pregnancy Status: No Living Arrangements: Other (Comment) (Hotel) Can pt return to current  living arrangement?: Yes Admission Status: Voluntary Is patient capable of signing voluntary admission?: Yes Referral Source: Self/Family/Friend     Crisis Care Plan Living Arrangements: Other (Comment) [employer]) Name of Psychiatrist: None Name of Therapist: none  Education Status Is patient currently in school?: No Highest grade of school patient has completed: GED  Risk to self with the past 6 months Suicidal Ideation: No Has patient been a risk to self within the past 6 months prior to admission? : Yes Suicidal Intent: No Has patient had any suicidal intent within the past 6 months prior to admission? : Yes Is patient at risk for suicide?: No Suicidal Plan?: No Has patient had any suicidal plan within the past 6 months prior to admission? : Yes Access to Means: Yes Specify Access to Suicidal Means: Knives What has been your use of drugs/alcohol within the last 12 months?: uses alcohol and some drug use Previous Attempts/Gestures: Yes How many times?: 3 Other Self Harm Risks: Cutting Triggers for Past Attempts: Other (Comment) Intentional Self Injurious Behavior:  ("everything") Family Suicide History: Unknown Recent stressful life event(s): Other (Comment) ("everything") Persecutory voices/beliefs?: No Depression: Yes Depression Symptoms: Insomnia, Isolating, Fatigue, Feeling worthless/self pity Substance abuse history and/or treatment for substance abuse?: Yes Suicide prevention information given to non-admitted patients: Not applicable  Risk to Others within the past 6 months Homicidal Ideation: No Does patient have any lifetime risk of violence toward others beyond the six months prior to admission? : No Thoughts of Harm to Others: No Current Homicidal Intent: No Current Homicidal Plan: No Access to Homicidal Means: No Identified Victim: Denies History of harm to others?: No Assessment of Violence: None Noted Violent Behavior Description: Denies Does patient  have access to weapons?: No Criminal Charges Pending?: No Does patient have a court date: No Is patient on probation?: No  Psychosis Hallucinations: None noted Delusions: None noted  Mental Status Report Appearance/Hygiene: In scrubs Eye Contact: Poor Motor Activity: Freedom of movement Speech: Logical/coherent, Slurred Level of Consciousness: Alert, Drowsy Mood: Sad Affect: Sad Anxiety Level: None Thought Processes: Coherent, Relevant Judgement: Unimpaired Orientation: Person, Place, Situation, Appropriate for developmental age Obsessive Compulsive Thoughts/Behaviors: None  Cognitive Functioning Concentration: Decreased Memory: Recent Intact, Remote Intact IQ: Average Insight: Fair Impulse Control: Fair Appetite: Good Sleep: Decreased Total Hours of Sleep: 3 Vegetative Symptoms: None  ADLScreening [facility] Assessment Services) Patient's cognitive ability adequate to safely complete daily activities?: Yes Patient able to express need for assistance with ADLs?: Yes Independently performs ADLs?: Yes (appropriate for developmental age)  Prior Inpatient Therapy Prior Inpatient Therapy: Yes Prior Therapy Dates: 10-12 years ago Prior Therapy Facilty/Provider(s): [facility] Reason for Treatment: SA/Depression  Prior Outpatient Therapy Prior Outpatient Therapy: Yes Prior Therapy Dates: 10-12 years ago Prior Therapy Facilty/Provider(s): [facility] Reason for Treatment: Sa/Depression Does patient have an ACCT team?: No Does patient have Intensive In-House Services?  : No Does patient have [facility] services? : No Does patient have P4CC services?: No  ADL Screening (condition at time of admission) Patient's cognitive ability adequate to safely complete daily activities?: Yes Is the patient deaf or have difficulty hearing?: No Does  the patient have difficulty seeing, even when wearing glasses/contacts?: No Does the patient have difficulty concentrating, remembering, or making  decisions?: No Patient able to express need for assistance with ADLs?: Yes Does the patient have difficulty dressing or bathing?: No Independently performs ADLs?: Yes (appropriate for developmental age) Does the patient have difficulty walking or climbing stairs?: No Weakness of Legs: None Weakness of Arms/Hands: None  Home Assistive Devices/Equipment Home Assistive Devices/Equipment: None  Therapy Consults (therapy consults require a physician order) PT Evaluation Needed: No OT Evalulation Needed: No SLP Evaluation Needed: No Abuse/Neglect Assessment (Assessment to be complete while patient is alone) Physical Abuse: Denies Verbal Abuse: Denies Sexual Abuse: Denies Exploitation of patient/patient's resources: Denies Self-Neglect: Denies Values / Beliefs Cultural Requests During Hospitalization: None Spiritual Requests During Hospitalization: None Consults Spiritual Care Consult Needed: No Social Work Consult Needed: No Merchant navy officer (For Healthcare) Does patient have an advance directive?: No Would patient like information on creating an advanced directive?: No - patient declined information    Additional Information 1:1 In Past 12 Months?: No CIRT Risk: No Elopement Risk: No Does patient have medical clearance?: Yes     Disposition:  Disposition Initial Assessment Completed for this Encounter: Yes Disposition of Patient: Other dispositions Other disposition(s):  (pending psychiatric consultation)  On Site Evaluation by:   Reviewed with Physician:    Tametra Ahart 09/01/2015 8:20 AM

## 2015-09-01 NOTE — ED Notes (Addendum)
Pt presents with [facility], voluntarily. Per [facility], pt states that he has a problem with alcohol and he was thinking about killing himself if he couldn't get help.   Per pt, he has a hx of depression and has been drinking since he was 16.  Pt's wife left him because of his drinking.  Pt wants to "get somewhere where I can get help for this depression and drinking."  Pt's last drink was around 430am; 3 40oz beers. Pt admits to occasional marijuana and cocaine use. Last cocaine use was 2 days ago, marijuana was 3 weeks ago.  Pt admits to hx of self-harm. Pt is SI with no plan.  Pt calm and cooperative at this time.

## 2015-09-01 NOTE — ED Provider Notes (Signed)
CSN: [id number]     Arrival date & time 09/01/15  0453 History   First MD Initiated Contact with Patient 09/01/15 [phone]     Chief Complaint  Patient presents with  . Alcohol Problem  . Suicidal     (Consider location/radiation/quality/duration/timing/severity/associated sxs/prior Treatment) The history is provided by the patient and medical records. No language interpreter was used.    Rodney Fernandez is a 34 y.o. male  with a hx of EtOH abuse, cutting behaviors presents to the Emergency Department.  Pt reports he drinks 12-18 beers per day.  He reports his last drink was around 4:30AM.  He admits to occasional marijuana and cocaine usage with the last being 2 days ago.  Pt reports his wife left him and he is feeling depressed.  He requests help with the drinking and depression.  Pt reports rehab many years ago.  Pt presents voluntarily.  He reports that he is concerned if he continues to drink he will start cutting again.  He denies SI, HI, auditory or visual hallucinations. Pt denies fever, chills, IVDU, headache, neck pain, chest pain, SOB, abd pain, N/V/D, weakness, dizziness, syncope.  Pt denies hx of EtOH withdrawal seizure.   Past Medical History  Diagnosis Date  . Pneumothorax 2014   Past Surgical History  Procedure Laterality Date  . Orthopedic surgery  2014    Metal placement in rght hand and left shoulder   No family history on file. Social History  Substance Use Topics  . Smoking status: Current Every Day Smoker  . Smokeless tobacco: None  . Alcohol Use: Yes    Review of Systems  Constitutional: Negative for fever, diaphoresis, appetite change, fatigue and unexpected weight change.  HENT: Negative for mouth sores.   Eyes: Negative for visual disturbance.  Respiratory: Negative for cough, chest tightness, shortness of breath and wheezing.   Cardiovascular: Negative for chest pain.  Gastrointestinal: Negative for nausea, vomiting, abdominal pain, diarrhea and constipation.   Endocrine: Negative for polydipsia, polyphagia and polyuria.  Genitourinary: Negative for dysuria, urgency, frequency and hematuria.  Musculoskeletal: Negative for back pain and neck stiffness.  Skin: Negative for rash.  Allergic/Immunologic: Negative for immunocompromised state.  Neurological: Negative for syncope, light-headedness and headaches.  Hematological: Does not bruise/bleed easily.  Psychiatric/Behavioral: Positive for dysphoric mood. Negative for sleep disturbance. The patient is not nervous/anxious.       Allergies  Haloperidol and related  Home Medications   Prior to Admission medications   Not on File   BP 120/75 mmHg  Pulse 78  Temp(Src) 98.2 F (36.8 C) (Oral)  Resp 18  SpO2 98% Physical Exam  Constitutional: He appears well-developed and well-nourished. No distress.  Awake, alert, nontoxic appearance  HENT:  Head: Normocephalic and atraumatic.  Mouth/Throat: Oropharynx is clear and moist. No oropharyngeal exudate.  Eyes: Conjunctivae are normal. No scleral icterus.  Neck: Normal range of motion. Neck supple.  Cardiovascular: Normal rate, regular rhythm, normal heart sounds and intact distal pulses.   No murmur heard. Pulmonary/Chest: Effort normal and breath sounds normal. No respiratory distress. He has no wheezes.  Equal chest expansion  Abdominal: Soft. Bowel sounds are normal. He exhibits no mass. There is no tenderness. There is no rebound and no guarding.  Musculoskeletal: Normal range of motion. He exhibits no edema.  Neurological: He is alert.  Speech is clear and goal oriented Moves extremities without ataxia  Skin: Skin is warm and dry. He is not diaphoretic.  Psychiatric: He is not actively hallucinating.  He exhibits a depressed mood. He expresses no homicidal and no suicidal ideation. He expresses no suicidal plans and no homicidal plans.  Nursing note and vitals reviewed.   ED Course  Procedures (including critical care time) Labs  Review Labs Reviewed  COMPREHENSIVE METABOLIC PANEL - Abnormal; Notable for the following:    Potassium 3.3 (*)    ALT 16 (*)    All other components within normal limits  ETHANOL - Abnormal; Notable for the following:    Alcohol, Ethyl (B) 120 (*)    All other components within normal limits  ACETAMINOPHEN LEVEL - Abnormal; Notable for the following:    Acetaminophen (Tylenol), Serum <10 (*)    All other components within normal limits  CBC - Abnormal; Notable for the following:    WBC 11.0 (*)    All other components within normal limits  URINE RAPID DRUG SCREEN, HOSP PERFORMED - Abnormal; Notable for the following:    Cocaine POSITIVE (*)    All other components within normal limits  SALICYLATE LEVEL    Imaging Review No results found. I have personally reviewed and evaluated these images and lab results as part of my medical decision-making.   EKG Interpretation None      MDM   Final diagnoses:  Severe recurrent major depression without psychotic features  Alcohol use disorder, severe, dependence   Rodney Fernandez presents with c/o EtOH abuse.  He denies SI to me, but endorsed this to both the RN and Tech at triage.  Will TTS, but I do not believe he will meet inpatient criteria.    8:00AM Labs reassuring.  Mild hypokalemia repleated in the department.  Pt with UDS cocaine + but denies CP or SOB.  Medically cleared.    2:37 PM Per TTS note, pt is pending a psychiatric evaluation.  He is here voluntarily and do not currently see a need for IVC as he denies SI/HI.  BP 120/75 mmHg  Pulse 78  Temp(Src) 98.2 F (36.8 C) (Oral)  Resp 18  SpO2 98%   Dierdre Forth, PA-C 09/01/15 1440  Linwood Dibbles, MD 09/06/15 1501

## 2015-09-01 NOTE — BHH Counselor (Signed)
Patient has been accepted to [facility] 956-2 Dr. Dub Mikes attending. Patient reviewed and signed the voluntary consent to treat and states that he does not have any questions or concerns at this time. Patient signed the ROI and denies questions or concerns at this time;; Patients paperwork was faxed to [facility] and provided to patients nurse for transport to [facility] after 1930.  Davina Poke, LCSW Therapeutic Triage Specialist [facility] 09/01/2015 4:10 PM

## 2015-09-01 NOTE — ED Notes (Signed)
Pt. Noted in day room. No complaints or concerns voiced. No distress or abnormal behavior noted. Will continue to monitor with security cameras. Q 15 minute rounds continue. 

## 2015-09-02 ENCOUNTER — Encounter (HOSPITAL_COMMUNITY): Payer: Self-pay

## 2015-09-02 DIAGNOSIS — F332 Major depressive disorder, recurrent severe without psychotic features: Secondary | ICD-10-CM

## 2015-09-02 DIAGNOSIS — F1020 Alcohol dependence, uncomplicated: Principal | ICD-10-CM

## 2015-09-02 MED ORDER — NICOTINE POLACRILEX 2 MG MT GUM
2.0000 mg | CHEWING_GUM | OROMUCOSAL | Status: DC | PRN
  Administered 2015-09-02 – 2015-09-11 (×30): 2 mg via ORAL
  Filled 2015-09-02 (×20): qty 1

## 2015-09-02 MED ORDER — DICYCLOMINE HCL 20 MG PO TABS
20.0000 mg | ORAL_TABLET | Freq: Four times a day (QID) | ORAL | Status: DC | PRN
  Administered 2015-09-02: 20 mg via ORAL
  Filled 2015-09-02: qty 1

## 2015-09-02 NOTE — BHH Suicide Risk Assessment (Signed)
[facility] Admission Suicide Risk Assessment   Nursing information obtained from:  Patient, Review of record Demographic factors:  Male, Caucasian, Living alone Current Mental Status:  NA Loss Factors:  Loss of significant relationship Historical Factors:  Family history of mental illness or substance abuse Risk Reduction Factors:  Responsible for children under 34 years of age, Employed Total Time spent with patient: 45 minutes Principal Problem: <principal problem not specified> Diagnosis:   Patient Active Problem List   Diagnosis Date Noted  . Severe recurrent major depression without psychotic features [F33.2] 09/01/2015  . Alcohol use disorder, severe, dependence [F10.20] 09/01/2015  . Severe alcohol use disorder [F10.99] 09/01/2015     Continued Clinical Symptoms:  Alcohol Use Disorder Identification Test Final Score (AUDIT): 34 The "Alcohol Use Disorders Identification Test", Guidelines for Use in Primary Care, Second Edition.  World Science writer [facility]). Score between 0-7:  no or low risk or alcohol related problems. Score between 8-15:  moderate risk of alcohol related problems. Score between 16-19:  high risk of alcohol related problems. Score 20 or above:  warrants further diagnostic evaluation for alcohol dependence and treatment.   CLINICAL FACTORS:   Depression:   Anhedonia Comorbid alcohol abuse/dependence Hopelessness Impulsivity Insomnia Alcohol/Substance Abuse/Dependencies More than one psychiatric diagnosis Previous Psychiatric Diagnoses and Treatments   Musculoskeletal: Strength & Muscle Tone: within normal limits Gait & Station: normal Patient leans: N/A  Psychiatric Specialty Exam: Physical Exam  ROS  Blood pressure 109/72, pulse 84, temperature 97.9 F (36.6 C), temperature source Oral, resp. rate 16.There is no weight on file to calculate BMI.  General Appearance: Disheveled and Fairly Groomed  Patent attorney::  Fair  Speech:  Slow  Volume:   Decreased  Mood:  Anxious, Depressed and Dysphoric  Affect:  Constricted and Depressed  Thought Process:  Goal Directed  Orientation:  Full (Time, Place, and Person)  Thought Content:  Rumination  Suicidal Thoughts:  Yes.  without intent/plan  Homicidal Thoughts:  No  Memory:  Immediate;   Fair Recent;   Fair Remote;   Fair  Judgement:  Fair  Insight:  Fair  Psychomotor Activity:  Decreased and Tremor  Concentration:  Fair  Recall:  [employer] of Knowledge:Fair  Language: Fair  Akathisia:  No  Handed:  Right  AIMS (if indicated):     Assets:  Communication Skills Desire for Improvement Physical Health  Sleep:  Number of Hours: 6.25  Cognition: WNL  ADL's:  Intact     COGNITIVE FEATURES THAT CONTRIBUTE TO RISK:  Polarized thinking and Thought constriction (tunnel vision)    SUICIDE RISK:   Mild:  Suicidal ideation of limited frequency, intensity, duration, and specificity.  There are no identifiable plans, no associated intent, mild dysphoria and related symptoms, good self-control (both objective and subjective assessment), few other risk factors, and identifiable protective factors, including available and accessible social support.  PLAN OF CARE: Patient is 34 year old male who was [facility] due to severe depression and having thoughts to cut himself.  His been drinking and using cocaine.  His major stressors are separation from his wife and financial issues.  Patient has history of cutting his wrist 10 years ago.  Currently he is not on any medication and reporting hopelessness and worthlessness.  Patient requires inpatient psychiatric treatment and stabilization.  Please see history, physical and treatment plan for more details.  Medical Decision Making:  Review of Psycho-Social Stressors (1), Review or order clinical lab tests (1), Decision to obtain  old records (1), Review and summation of old records (2), Established Problem, Worsening (2), New  Problem, with no additional work-up planned (3), Review of Medication Regimen & Side Effects (2) and Review of New Medication or Change in Dosage (2)  I certify that inpatient services furnished can reasonably be expected to improve the patient's condition.   ARFEEN,SYED T. 09/02/2015, 1:13 PM
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
## 2015-09-02 NOTE — Plan of Care (Signed)
Problem: Alteration in mood & ability to function due to Goal: STG-Patient will attend groups Outcome: Progressing Pt attended evening AA group     

## 2015-09-02 NOTE — Tx Team (Signed)
Initial Interdisciplinary Treatment Plan   PATIENT STRESSORS: Substance abuse   PATIENT STRENGTHS: Ability for insight Average or above average intelligence Motivation for treatment/growth Supportive family/friends   PROBLEM LIST: Problem List/Patient Goals Date to be addressed Date deferred Reason deferred Estimated date of resolution  ETOH abuse 09/01/2015     depression 09/01/2015     anxiety 09/01/2015     Risk for suicide 09/01/2015     "i want to stop drinking" 09/01/2015     "i need help with depression" 09/01/2015                        DISCHARGE CRITERIA:  Adequate post-discharge living arrangements Improved stabilization in mood, thinking, and/or behavior Withdrawal symptoms are absent or subacute and managed without 24-hour nursing intervention  PRELIMINARY DISCHARGE PLAN: Attend 12-step recovery group Placement in alternative living arrangements Return to previous work or school arrangements  PATIENT/FAMIILY INVOLVEMENT: This treatment plan has been presented to and reviewed with the patient, Rodney Fernandez, and/or family member, The patient and family have been given the opportunity to ask questions and make suggestions.  JEHU-APPIAH, Ellsworth Waldschmidt K 09/02/2015, 12:23 AM

## 2015-09-02 NOTE — Progress Notes (Signed)
Patient ID: Rodney Fernandez, male   DOB: [birth year], 34 y.o.   MRN: [id number] D: Patient alert and cooperative. Pt detoxing from EOTH c/o chills, anxiety, and abdominal cramp earlier but has subsided this evening. Pt denies SI/HI/AVH. No acute distressed noted at this time.   A: Medications administered as prescribed. Emotional support given and will continue to monitor pt's progress for stabilization.  R: Patient remains safe and complaint with medications.

## 2015-09-02 NOTE — Progress Notes (Signed)
Patient ID: Rodney Fernandez, male   DOB: 1981-07-20, 34 y.o.   MRN: [id number] Admission note: D:Patient is a voluntary admission in no acute distress for depression and ETOH abuse. Pt report  recent break up with wife over his drinking. Pt reports he is currently leaving in a hotel. Pt reports drinking 12-18 beers daily since the age of 56. Pt report history of cutting and attempted OD 10 years ago. Pt goal is get help with depression and drinking. Pt denies SI/HI/AVH and pain  A: Pt admitted to unit per protocol, skin assessment and belonging search done. No skin issues noted. Consent signed by pt. Pt educated on therapeutic milieu rules. Pt was introduced to milieu by nursing staff. Fall risk safety plan explained to the patient. 15 minutes checks started for safety.  R: Pt was receptive to education. Writer offered support.

## 2015-09-02 NOTE — Progress Notes (Signed)
D: Pt presents with flat affect and depressed mood. Pt rates depression 6/10. Hopeless 5/10. Anxiety 5/10. Pt denies suicidal thoughts. Pt reports withdrawal symptoms of sweats, abd cramps, chills and agitation. May, NP., made aware of pt complaint. Pt ordered Bentyl for discomfort. Pt reported feeling depressed d/t not being able to speak to his wife and two year old child. Pt compliant with taking meds and attending groups. No adverse reaction to meds verbalized by pt.  A: Medications administered as ordered per MD. Verbal support given. Pt encouraged to attend groups. 15 minute checks performed for safety.  R: Pt receptive to tx.

## 2015-09-02 NOTE — [facility] Group Notes (Signed)
[facility] LCSW Group Therapy  09/02/2015   10:00 AM  Type of Therapy:  Group Therapy  Participation Level:  Active  Participation Quality:  Appropriate  Affect:  Appropriate  Cognitive:  Appropriate  Insight:  Developing/Improving  Engagement in Therapy:  Developing/Improving  Modes of Intervention:  Discussion, Exploration, Problem-solving, Socialization and Support  Summary of Progress/Problems: The main focus of today's process group was to identify the patient's current support system and decide on other supports that can be put in place. An emphasis was placed on using counselor, doctor, therapy groups, 12-step groups, and problem-specific support groups to expand supports. There was also an extensive discussion about what constitutes a healthy support versus an unhealthy support. The patient expressed understanding of the concepts presented, and agreed that there is a need to add more supports. The patient stated willingness to add a therapist if medical staff and CSW think appropriate and willingness to add supports for sobriety. Patient was open to sharing stressors which include recent foreclosure, separation and housing issues.   Clide Dales

## 2015-09-02 NOTE — [facility] Suicide Risk Assessment (Signed)
[facility] Adult Inpatient Family/Significant Other Suicide Prevention Education  Suicide Prevention Education:   Patient Refusal for Family/Significant Other Suicide Prevention Education: The patient has refused to provide written consent for family/significant other to be provided Family/Significant Other Suicide Prevention Education during admission and/or prior to discharge.  Physician notified.  CSW provided suicide prevention information with patient.    The suicide prevention education provided includes the following:  Suicide risk factors  Suicide prevention and interventions  National Suicide Hotline telephone number  [facility] assessment telephone number  [facility] Emergency Assistance 911  [facility] and/or Residential Mobile Crisis Unit telephone number   Reyes Ivan, [place] 09/02/2015 9:44 AM

## 2015-09-03 NOTE — Progress Notes (Signed)
Pt attended AA group this evening.  

## 2015-09-03 NOTE — Progress Notes (Signed)
[facility] MD Progress Note  09/03/2015 6:17 PM Rodney Fernandez  MRN:  [id number]  Subjective:  Cordie says; "I came to the hospital because I was drinking a lot of alcohol & started to think about cutting myself again. I cut myself because of my depression too. I'm still feeling down & out for losing my wife & my child. I still have the chills, sweats off & on. But, suicidal thoughts, none today. My main concern after discharge is finding a place to live". Omarius denies any SIHI, AVH.  Principal Problem: <principal problem not specified>  Diagnosis:   Patient Active Problem List   Diagnosis Date Noted  . Severe recurrent major depression without psychotic features [F33.2] 09/01/2015  . Alcohol use disorder, severe, dependence [F10.20] 09/01/2015  . Severe alcohol use disorder [F10.99] 09/01/2015   Total Time spent with patient: 25 minutes  Past Medical History:  Past Medical History  Diagnosis Date  . Pneumothorax 2014    Past Surgical History  Procedure Laterality Date  . Orthopedic surgery  2014    Metal placement in rght hand and left shoulder   Family History: History reviewed. No pertinent family history.  Social History:  History  Alcohol Use  . Yes     History  Drug Use  . Yes  . Special: Cocaine, Marijuana    Social History   Social History  . Marital Status: Single    Spouse Name: N/A  . Number of Children: N/A  . Years of Education: N/A   Social History Main Topics  . Smoking status: Current Every Day Smoker  . Smokeless tobacco: None  . Alcohol Use: Yes  . Drug Use: Yes    Special: Cocaine, Marijuana  . Sexual Activity: Yes   Other Topics Concern  . None   Social History Narrative   Additional History:    Sleep: Good  Appetite:  Fair  Assessment: Alcohol use disorder, severe  Musculoskeletal: Strength & Muscle Tone: within normal limits Gait & Station: normal Patient leans: N/A  Psychiatric Specialty Exam: Physical Exam  Review of Systems   Constitutional: Negative.   HENT: Negative.   Eyes: Negative.   Respiratory: Negative.   Cardiovascular: Negative.   Gastrointestinal: Negative.   Genitourinary: Negative.   Musculoskeletal: Negative.   Skin: Negative.   Neurological: Negative.   Endo/Heme/Allergies: Negative.   Psychiatric/Behavioral: Positive for depression and substance abuse (Alcoholism, chronic). Negative for suicidal ideas, hallucinations and memory loss. The patient is nervous/anxious and has insomnia (Improving).     Blood pressure 94/49, pulse 71, temperature 97.7 F (36.5 C), temperature source Oral, resp. rate 16, SpO2 99 %.There is no weight on file to calculate BMI.  General Appearance: Casual  Eye Contact::  Good  Speech:  Clear and Coherent  Volume:  Normal  Mood:  Depressed  Affect:  Flat  Thought Process:  Coherent and Goal Directed  Orientation:  Full (Time, Place, and Person)  Thought Content:  Rumination  Suicidal Thoughts:  No  Homicidal Thoughts:  No  Memory:  Grossly intact  Judgement:  Fair  Insight:  Present  Psychomotor Activity:  Tremor, Sweats  Concentration:  Fair  Recall:  Good  Fund of Knowledge:Fair  Language: Fair  Akathisia:  No  Handed:  Right  AIMS (if indicated):     Assets:  Communication Skills Desire for Improvement  ADL's:  Intact  Cognition: WNL  Sleep:  Number of Hours: 6.25   Current Medications: Current Facility-Administered Medications  Medication Dose Route Frequency  Provider Last Rate Last Dose  . acetaminophen (TYLENOL) tablet 650 mg  650 mg Oral Q6H PRN Earney Navy, NP      . alum & mag hydroxide-simeth (MAALOX/MYLANTA) 200-200-20 MG/5ML suspension 30 mL  30 mL Oral Q4H PRN Earney Navy, NP      . chlordiazePOXIDE (LIBRIUM) capsule 25 mg  25 mg Oral Q6H PRN Earney Navy, NP      . Melene Muller ON 09/04/2015] chlordiazePOXIDE (LIBRIUM) capsule 25 mg  25 mg Oral BH-qamhs Earney Navy, NP       Followed by  . [START ON 09/05/2015]  chlordiazePOXIDE (LIBRIUM) capsule 25 mg  25 mg Oral Daily Earney Navy, NP      . dicyclomine (BENTYL) tablet 20 mg  20 mg Oral QID PRN Adonis Brook, NP   20 mg at 09/02/15 1723  . FLUoxetine (PROZAC) capsule 10 mg  10 mg Oral Daily Earney Navy, NP   10 mg at 09/03/15 0758  . hydrOXYzine (ATARAX/VISTARIL) tablet 25 mg  25 mg Oral Q6H PRN Earney Navy, NP   25 mg at 09/02/15 2144  . loperamide (IMODIUM) capsule 2-4 mg  2-4 mg Oral PRN Earney Navy, NP      . magnesium hydroxide (MILK OF MAGNESIA) suspension 30 mL  30 mL Oral Daily PRN Earney Navy, NP      . multivitamin with minerals tablet 1 tablet  1 tablet Oral Daily Earney Navy, NP   1 tablet at 09/03/15 0758  . nicotine polacrilex (NICORETTE) gum 2 mg  2 mg Oral PRN Rachael Fee, MD   2 mg at 09/03/15 1401  . ondansetron (ZOFRAN-ODT) disintegrating tablet 4 mg  4 mg Oral Q6H PRN Earney Navy, NP      . thiamine (VITAMIN B-1) tablet 100 mg  100 mg Oral Daily Earney Navy, NP   100 mg at 09/03/15 0758  . traZODone (DESYREL) tablet 50 mg  50 mg Oral QHS PRN Earney Navy, NP   50 mg at 09/02/15 2144    Lab Results: No results found for this or any previous visit (from the past 48 hour(s)).  Physical Findings: AIMS: Facial and Oral Movements Muscles of Facial Expression: None, normal Lips and Perioral Area: None, normal Jaw: None, normal Tongue: None, normal,Extremity Movements Upper (arms, wrists, hands, fingers): None, normal Lower (legs, knees, ankles, toes): None, normal, Trunk Movements Neck, shoulders, hips: None, normal, Overall Severity Severity of abnormal movements (highest score from questions above): None, normal Incapacitation due to abnormal movements: None, normal Patient's awareness of abnormal movements (rate only patient's report): No Awareness, Dental Status Current problems with teeth and/or dentures?: No Does patient usually wear dentures?: No  CIWA:   CIWA-Ar Total: 1 COWS:     Treatment Plan Summary: Daily contact with patient to assess and evaluate symptoms and progress in treatment and Medication management:   Plan: Supportive approach/coping skills/relapse prevention           Depression: will continue the Prozac 20 mg and continue to work with mindfulness, CBT help  identify the cognitive distortions that keep the depression going          Discuss other life style changes that can help with both his depression and his alcohol use such like exercise, meditation           Alcohol Dependence: will continue the Librium detox protocols, will use motivational interviewing and encourage to pursue total abstinence  Insomnia: continue Trazodone 50 mg           Continue to educate in terms of AA and other recovery strategies  Medical Decision Making:  Review of Psycho-Social Stressors (1), Review of Last Therapy Session (1), Review of Medication Regimen & Side Effects (2) and Review of New Medication or Change in Dosage (2)  Sanjuana Kava, PMHN, FNP-BC 09/03/2015, 6:17 PM

## 2015-09-03 NOTE — Progress Notes (Signed)
D    Pt is pleasant on approach and has been cooperative    He verbalizes being vested in his treatment   He said he has lost everything and realizes it is time to change his ways     Pt denies withdrawal symptoms at this time A   Verbal support given    Medications administered and effectiveness monitored   Discussed withdrawal symptoms and some possible treatment options with patient  Q 15 min checks R   Pt safe at present and verbalizes understanding

## 2015-09-03 NOTE — Progress Notes (Signed)
Pt attended A.A. Wrap up group. 

## 2015-09-03 NOTE — Progress Notes (Signed)
Recreation Therapy Notes  Date: 09.05.2016 Time: 9:30am Location: [address] Group Room   Group Topic: Stress Management  Goal Area(s) Addresses:  Patient will actively participate in stress management techniques presented during session.   Behavioral Response: Did not attend.   [name] [name], LRT/CTRS         [name] L 09/03/2015 1:33 PM 

## 2015-09-03 NOTE — Progress Notes (Addendum)
D: Pt presents with flat affect and depressed mood. Pt reports that he's doing better today and would like to start discharge process. Pt would like to return back to work soon. Pt seeking shelter because he is currently homeless. Pt denies suicidal thoughts today. Pt denies experiencing any withdrawal symptoms this morning. Pt reported that he slept well last night for the first time, in a long time. Pt have minimal interaction this morning and did not attend groups. Pt compliant with taking meds and no adverse reaction to meds verbalized by pt.  A: Medications administered as ordered per MD. Verbal support given. Pt encouraged to attend groups. 15 minute checks performed for safety.  R: Pt receptive to tx.   On pt self inventory sheet, pt reports depression 8/10. Hopeless 8/10. Anxiety 7/10.

## 2015-09-03 NOTE — [facility] Group Notes (Signed)
[facility] LCSW Aftercare Discharge Planning Group Note  09/03/2015  8:45 AM  Participation Quality: Did Not Attend. Patient invited to participate but declined.  Dmari Schubring, MSW, LCSWA Clinical Social Worker [facility] [phone]   

## 2015-09-04 DIAGNOSIS — F1099 Alcohol use, unspecified with unspecified alcohol-induced disorder: Secondary | ICD-10-CM

## 2015-09-04 MED ORDER — MIRTAZAPINE 15 MG PO TABS
15.0000 mg | ORAL_TABLET | Freq: Every day | ORAL | Status: DC
  Administered 2015-09-04 – 2015-09-10 (×7): 15 mg via ORAL
  Filled 2015-09-04 (×9): qty 1

## 2015-09-04 MED ORDER — BOOST / RESOURCE BREEZE PO LIQD
1.0000 | Freq: Two times a day (BID) | ORAL | Status: DC
  Administered 2015-09-04 – 2015-09-11 (×12): 1 via ORAL
  Filled 2015-09-04 (×17): qty 1

## 2015-09-04 MED ORDER — ESCITALOPRAM OXALATE 10 MG PO TABS
10.0000 mg | ORAL_TABLET | Freq: Every day | ORAL | Status: DC
  Administered 2015-09-05 – 2015-09-07 (×3): 10 mg via ORAL
  Filled 2015-09-04 (×5): qty 1

## 2015-09-04 MED ORDER — GABAPENTIN 100 MG PO CAPS
100.0000 mg | ORAL_CAPSULE | Freq: Three times a day (TID) | ORAL | Status: DC
  Administered 2015-09-04 – 2015-09-07 (×10): 100 mg via ORAL
  Filled 2015-09-04 (×13): qty 1

## 2015-09-04 NOTE — Clinical Social Work Note (Signed)
CSW provided patient with information on Legacy/motel programs at his request.   Samuella Bruin, MSW, [facility] [place] Clinical Social Worker [facility] [phone]'
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
## 2015-09-04 NOTE — Progress Notes (Signed)
[facility] MD Progress Note  09/04/2015 10:20 AM Rodney Fernandez  MRN:  [id number] Subjective:  States he is feeling very overwhelmed. He used to cut before to cope with the way he was feeling. He saw himself getting to that point again and asked for help. States he is having a hard time dealing with the separation and not being able to see his 34 Y/O. He states he needs to do this not only for himself but for his kid because otherwise he is not going to be allowed to be part of his life. States he is no Prozac and it is not doing anything for him, but yet they don't want to change it. Principal Problem: <principal problem not specified> Diagnosis:   Patient Active Problem List   Diagnosis Date Noted  . Severe recurrent major depression without psychotic features [F33.2] 09/01/2015  . Alcohol use disorder, severe, dependence [F10.20] 09/01/2015  . Severe alcohol use disorder [F10.99] 09/01/2015   Total Time spent with patient: 30 minutes   Past Medical History:  Past Medical History  Diagnosis Date  . Pneumothorax 2014    Past Surgical History  Procedure Laterality Date  . Orthopedic surgery  2014    Metal placement in rght hand and left shoulder   Family History: History reviewed. No pertinent family history. Social History:  History  Alcohol Use  . Yes     History  Drug Use  . Yes  . Special: Cocaine, Marijuana    Social History   Social History  . Marital Status: Single    Spouse Name: N/A  . Number of Children: N/A  . Years of Education: N/A   Social History Main Topics  . Smoking status: Current Every Day Smoker  . Smokeless tobacco: None  . Alcohol Use: Yes  . Drug Use: Yes    Special: Cocaine, Marijuana  . Sexual Activity: Yes   Other Topics Concern  . None   Social History Narrative   Additional History:    Sleep: Fair  Appetite:  Fair   Assessment:   Musculoskeletal: Strength & Muscle Tone: within normal limits Gait & Station: normal Patient leans:  normal   Psychiatric Specialty Exam: Physical Exam  Review of Systems  Constitutional: Positive for malaise/fatigue.  Eyes: Positive for blurred vision.  Respiratory:       Pack and a half a day  Cardiovascular: Negative.   Gastrointestinal: Negative.   Genitourinary: Negative.   Musculoskeletal: Negative.   Skin: Negative.   Neurological: Positive for weakness and headaches.  Endo/Heme/Allergies: Negative.   Psychiatric/Behavioral: Positive for depression, suicidal ideas and substance abuse. The patient is nervous/anxious and has insomnia.     Blood pressure 112/78, pulse 78, temperature 98 F (36.7 C), temperature source Oral, resp. rate 16, SpO2 99 %.There is no weight on file to calculate BMI.  General Appearance: Fairly Groomed  Patent attorney::  Fair  Speech:  Clear and Coherent  Volume:  fluctuates  Mood:  Anxious and Depressed  Affect:  Depressed and Tearful  Thought Process:  Coherent and Goal Directed  Orientation:  Full (Time, Place, and Person)  Thought Content:  symptoms events worries concerns  Suicidal Thoughts:  At times but would not hurt himself  Homicidal Thoughts:  No  Memory:  Immediate;   Fair Recent;   Fair Remote;   Fair  Judgement:  Fair  Insight:  Present and Shallow  Psychomotor Activity:  Restlessness  Concentration:  Fair  Recall:  [employer] of Knowledge:Fair  Language: Fair  Akathisia:  No  Handed:  Right  AIMS (if indicated):     Assets:  Desire for Improvement  ADL's:  Intact  Cognition: WNL  Sleep:  Number of Hours: 6.25     Current Medications: Current Facility-Administered Medications  Medication Dose Route Frequency Provider Last Rate Last Dose  . acetaminophen (TYLENOL) tablet 650 mg  650 mg Oral Q6H PRN Earney Navy, NP      . alum & mag hydroxide-simeth (MAALOX/MYLANTA) 200-200-20 MG/5ML suspension 30 mL  30 mL Oral Q4H PRN Earney Navy, NP      . chlordiazePOXIDE (LIBRIUM) capsule 25 mg  25 mg Oral Q6H PRN  Earney Navy, NP      . chlordiazePOXIDE (LIBRIUM) capsule 25 mg  25 mg Oral BH-qamhs Earney Navy, NP   25 mg at 09/04/15 0806   Followed by  . [START ON 09/05/2015] chlordiazePOXIDE (LIBRIUM) capsule 25 mg  25 mg Oral Daily Earney Navy, NP      . dicyclomine (BENTYL) tablet 20 mg  20 mg Oral QID PRN Adonis Brook, NP   20 mg at 09/02/15 1723  . feeding supplement (BOOST / RESOURCE BREEZE) liquid 1 Container  1 Container Oral BID BM Anderson [place], RD      . FLUoxetine (PROZAC) capsule 10 mg  10 mg Oral Daily Earney Navy, NP   10 mg at 09/04/15 0805  . hydrOXYzine (ATARAX/VISTARIL) tablet 25 mg  25 mg Oral Q6H PRN Earney Navy, NP   25 mg at 09/02/15 2144  . loperamide (IMODIUM) capsule 2-4 mg  2-4 mg Oral PRN Earney Navy, NP      . magnesium hydroxide (MILK OF MAGNESIA) suspension 30 mL  30 mL Oral Daily PRN Earney Navy, NP      . multivitamin with minerals tablet 1 tablet  1 tablet Oral Daily Earney Navy, NP   1 tablet at 09/04/15 0805  . nicotine polacrilex (NICORETTE) gum 2 mg  2 mg Oral PRN Rachael Fee, MD   2 mg at 09/04/15 0920  . ondansetron (ZOFRAN-ODT) disintegrating tablet 4 mg  4 mg Oral Q6H PRN Earney Navy, NP      . thiamine (VITAMIN B-1) tablet 100 mg  100 mg Oral Daily Earney Navy, NP   100 mg at 09/04/15 0805  . traZODone (DESYREL) tablet 50 mg  50 mg Oral QHS PRN Earney Navy, NP   50 mg at 09/03/15 2122    Lab Results: No results found for this or any previous visit (from the past 48 hour(s)).  Physical Findings: AIMS: Facial and Oral Movements Muscles of Facial Expression: None, normal Lips and Perioral Area: None, normal Jaw: None, normal Tongue: None, normal,Extremity Movements Upper (arms, wrists, hands, fingers): None, normal Lower (legs, knees, ankles, toes): None, normal, Trunk Movements Neck, shoulders, hips: None, normal, Overall Severity Severity of abnormal movements (highest score  from questions above): None, normal Incapacitation due to abnormal movements: None, normal Patient's awareness of abnormal movements (rate only patient's report): No Awareness, Dental Status Current problems with teeth and/or dentures?: No Does patient usually wear dentures?: No  CIWA:  CIWA-Ar Total: 1 COWS:     Treatment Plan Summary: Daily contact with patient to assess and evaluate symptoms and progress in treatment and Medication management Supportive approach/coping skills Alcohol dependence; continue the detox protocol/work a relapse prevention plan Depression; D/C the Prozac, start Lexapro 10 mg daily Work with  CBT/mindfulness Introduce DBT principles to help with the cutting  Explore residential treatment programs  Medical Decision Making:  Review of Psycho-Social Stressors (1), Review or order clinical lab tests (1), Review of Medication Regimen & Side Effects (2) and Review of New Medication or Change in Dosage (2)     Howard Patton A 09/04/2015, 10:20 AM

## 2015-09-04 NOTE — Progress Notes (Signed)
D    Pt is pleasant on approach and has been cooperative    He verbalizes being vested in his treatment   He said he has lost everything and realizes it is time to change his ways   Pt reports not having a very good day and does not think his family situation with change soon  Pt denies withdrawal symptoms at this time and said the medication is working to keep the withdrawal away  A   Verbal support given    Medications administered and effectiveness monitored   Discussed withdrawal symptoms and some possible treatment options with patient  Q 15 min checks R   Pt safe at present and verbalizes understanding

## 2015-09-04 NOTE — Progress Notes (Signed)
NUTRITION ASSESSMENT  Pt identified as at risk on the Malnutrition Screen Tool  INTERVENTION: 1. Educated patient on the importance of nutrition and encouraged intake of food and beverages. 2. Discussed weight goals. 3. Supplements: will order Boost Breeze BID, each supplement provides 250 kcal and 9 grams of protein   NUTRITION DIAGNOSIS: Unintentional weight loss related to sub-optimal intake as evidenced by pt report.   Goal: Pt to meet >/= 90% of their estimated nutrition needs.  Monitor:  PO intake  Assessment:  Pt seen for MST. Limited weight hx available in the chart. BMI was calculated by RD as no BMI appeared in the chart.   Per notes, pt was drinking 12-18 beers/day PTA. He had previously been sober for 2 years and began drinking heavily again recently. Will order Boost Breeze to supplement intakes.  34 y.o. male  Height: Ht Readings from Last 1 Encounters:  03/14/14 6' (1.829 m)    Weight: Wt Readings from Last 1 Encounters:  03/14/14 175 lb (79.379 kg)    Weight Hx: Wt Readings from Last 10 Encounters:  03/14/14 175 lb (79.379 kg)    BMI:  23.7 kg/m2 Pt meets criteria for normal weight based on current BMI.  Estimated Nutritional Needs: Kcal: 25-30 kcal/kg Protein: > 1 gram protein/kg Fluid: 1 ml/kcal  Diet Order: Diet regular Room service appropriate?: Yes; Fluid consistency:: Thin Pt is also offered choice of unit snacks mid-morning and mid-afternoon.  Pt is eating as desired.   Lab results and medications reviewed.      Trenton Gammon, RD, LDN Inpatient Clinical Dietitian Pager # [phone] After hours/weekend pager # [phone]

## 2015-09-04 NOTE — Progress Notes (Signed)
Adult Psychoeducational Group Note  Date:  09/04/2015 Time:  9:40 PM  Group Topic/Focus:  Wrap-Up Group:   The focus of this group is to help patients review their daily goal of treatment and discuss progress on daily workbooks.  Participation Level:  Active  Participation Quality:  Appropriate  Affect:  Appropriate  Cognitive:  Appropriate  Insight: Appropriate  Engagement in Group:  Engaged  Modes of Intervention:  Discussion  Additional Comments:  Octavio Manns 09/04/2015, 9:40 PM

## 2015-09-04 NOTE — Progress Notes (Signed)
Recreation Therapy Notes  Animal-Assisted Activity (AAA) Program Checklist/Progress Notes Patient Eligibility Criteria Checklist & Daily Group note for Rec Tx Intervention  Date: 09.06.2016 Time: 2:45pm Location: [address] Dayroom    AAA/T Program Assumption of Risk Form signed by Patient/ or Parent Legal Guardian yes  Patient is free of allergies or sever asthma yes  Patient reports no fear of animals yes  Patient reports no history of cruelty to animals yes  Patient understands his/her participation is voluntary yes  Behavioral Response: Did not attend.   Tiffiney Sparrow L Anabell Swint, LRT/CTRS  Kameron Blethen L 09/04/2015 3:07 PM 

## 2015-09-04 NOTE — [facility] Group Notes (Signed)
[facility] LCSW Group Therapy  09/04/2015 1:26 PM  Type of Therapy:  Group Therapy  Participation Level:  Active  Participation Quality:  Attentive  Affect:  Appropriate  Cognitive:  Alert and Oriented  Insight:  Engaged  Engagement in Therapy:  Engaged  Modes of Intervention:  Discussion, Education, Exploration, Problem-solving, Rapport Building, Socialization and Support  Summary of Progress/Problems: MHA Speaker came to talk about his personal journey with substance abuse and addiction. The pt processed ways by which to relate to the speaker. MHA speaker provided handouts and educational information pertaining to groups and services offered by the [facility].   Smart, Sue Mcalexander LCSWA 09/04/2015, 1:26 PM

## 2015-09-05 NOTE — Progress Notes (Signed)
[facility] MD Progress Note  09/05/2015 3:14 PM Rodney Fernandez  MRN:  [id number] Subjective:  Rodney Fernandez is still endorsing feeling depressed. Understands that  things are changing for him. He is still dealing with the loss of the relationship with his wife and the possible loss in the relationship with his son. He wants to get better. He heard that his employer is willing to work with him. So this has decreased his stress level as he can go for further treatment. He wants to develop better coping strategies so when he feels like cutting he can develop alternate ways of dealing with the dysphoria Principal Problem: Severe recurrent major depression without psychotic features Diagnosis:   Patient Active Problem List   Diagnosis Date Noted  . Severe recurrent major depression without psychotic features [F33.2] 09/01/2015  . Alcohol use disorder, severe, dependence [F10.20] 09/01/2015  . Severe alcohol use disorder [F10.99] 09/01/2015   Total Time spent with patient: 30 minutes   Past Medical History:  Past Medical History  Diagnosis Date  . Pneumothorax 2014    Past Surgical History  Procedure Laterality Date  . Orthopedic surgery  2014    Metal placement in rght hand and left shoulder   Family History: History reviewed. No pertinent family history. Social History:  History  Alcohol Use  . Yes     History  Drug Use  . Yes  . Special: Cocaine, Marijuana    Social History   Social History  . Marital Status: Single    Spouse Name: N/A  . Number of Children: N/A  . Years of Education: N/A   Social History Main Topics  . Smoking status: Current Every Day Smoker  . Smokeless tobacco: None  . Alcohol Use: Yes  . Drug Use: Yes    Special: Cocaine, Marijuana  . Sexual Activity: Yes   Other Topics Concern  . None   Social History Narrative   Additional History:    Sleep: Fair  Appetite:  Fair   Assessment:   Musculoskeletal: Strength & Muscle Tone: within normal limits Gait &  Station: normal Patient leans: normal   Psychiatric Specialty Exam: Physical Exam  Review of Systems  Constitutional: Negative.   HENT: Negative.   Eyes: Negative.   Respiratory: Negative.   Cardiovascular: Negative.   Gastrointestinal: Negative.   Genitourinary: Negative.   Musculoskeletal: Negative.   Skin: Negative.   Neurological: Negative.   Endo/Heme/Allergies: Negative.   Psychiatric/Behavioral: Positive for depression and substance abuse. The patient is nervous/anxious.     Blood pressure 117/85, pulse 90, temperature 97.8 F (36.6 C), temperature source Oral, resp. rate 20, SpO2 99 %.There is no weight on file to calculate BMI.  General Appearance: Fairly Groomed  Patent attorney::  Fair  Speech:  Clear and Coherent  Volume:  Normal  Mood:  Anxious and Depressed  Affect:  Restricted  Thought Process:  Coherent and Goal Directed  Orientation:  Full (Time, Place, and Person)  Thought Content:  symptoms events worries concerns  Suicidal Thoughts:  No  Homicidal Thoughts:  No  Memory:  Immediate;   Fair Recent;   Fair Remote;   Fair  Judgement:  Fair  Insight:  Present and Shallow  Psychomotor Activity:  Restlessness  Concentration:  Fair  Recall:  [employer] of Knowledge:Fair  Language: Fair  Akathisia:  No  Handed:  Right  AIMS (if indicated):     Assets:  Desire for Improvement  ADL's:  Intact  Cognition: WNL  Sleep:  Number of Hours: 6.75     Current Medications: Current Facility-Administered Medications  Medication Dose Route Frequency Provider Last Rate Last Dose  . acetaminophen (TYLENOL) tablet 650 mg  650 mg Oral Q6H PRN Earney Navy, NP      . alum & mag hydroxide-simeth (MAALOX/MYLANTA) 200-200-20 MG/5ML suspension 30 mL  30 mL Oral Q4H PRN Earney Navy, NP      . dicyclomine (BENTYL) tablet 20 mg  20 mg Oral QID PRN Adonis Brook, NP   20 mg at 09/02/15 1723  . escitalopram (LEXAPRO) tablet 10 mg  10 mg Oral Daily Rachael Fee, MD    10 mg at 09/05/15 0829  . feeding supplement (BOOST / RESOURCE BREEZE) liquid 1 Container  1 Container Oral BID BM Renie Ora, RD   1 Container at 09/05/15 [phone]  . gabapentin (NEURONTIN) capsule 100 mg  100 mg Oral TID Rachael Fee, MD   100 mg at 09/05/15 1155  . magnesium hydroxide (MILK OF MAGNESIA) suspension 30 mL  30 mL Oral Daily PRN Earney Navy, NP      . mirtazapine (REMERON) tablet 15 mg  15 mg Oral QHS Rachael Fee, MD   15 mg at 09/04/15 2147  . multivitamin with minerals tablet 1 tablet  1 tablet Oral Daily Earney Navy, NP   1 tablet at 09/05/15 0829  . nicotine polacrilex (NICORETTE) gum 2 mg  2 mg Oral PRN Rachael Fee, MD   2 mg at 09/05/15 0830  . thiamine (VITAMIN B-1) tablet 100 mg  100 mg Oral Daily Earney Navy, NP   100 mg at 09/05/15 9604    Lab Results: No results found for this or any previous visit (from the past 48 hour(s)).  Physical Findings: AIMS: Facial and Oral Movements Muscles of Facial Expression: None, normal Lips and Perioral Area: None, normal Jaw: None, normal Tongue: None, normal,Extremity Movements Upper (arms, wrists, hands, fingers): None, normal Lower (legs, knees, ankles, toes): None, normal, Trunk Movements Neck, shoulders, hips: None, normal, Overall Severity Severity of abnormal movements (highest score from questions above): None, normal Incapacitation due to abnormal movements: None, normal Patient's awareness of abnormal movements (rate only patient's report): No Awareness, Dental Status Current problems with teeth and/or dentures?: No Does patient usually wear dentures?: No  CIWA:  CIWA-Ar Total: 0 COWS:     Treatment Plan Summary: Daily contact with patient to assess and evaluate symptoms and progress in treatment and Medication management Supportive approach/coping skills Depression; will continue to work with the Lexapro 10 mg and consider increasing to 15 depending on tolerability or early response.   Insomnia; will continue to work with the Remeron 15 mg HS Anxiety; will continue to work with the Neurontin  Use CBT/mindfulness Medical Decision Making:  Review of Psycho-Social Stressors (1) and Review of Medication Regimen & Side Effects (2)     Festus Pursel A 09/05/2015, 3:14 PM

## 2015-09-05 NOTE — Progress Notes (Signed)
Pt attended NA group this evening.  

## 2015-09-05 NOTE — Progress Notes (Signed)
D:  Pt continues to be very flat and depressed on the unit today. Pt reported not having slept well last night even with the  help of sleeping medication. Pt has a fair appetite, low energy level and poor concentration. When asked his goal for today, pt said, " I don't know." Denied physical pain, but said sometime have those thoughts of hurting self. Pt reported that his depression was a 8, his hopelessness was a 8, and that his anxiety was a 8. Pt reported being negative SI/HI, no AH/VH noted. A: 15 min checks continued for patient safety. R: Pts safety maintained.

## 2015-09-05 NOTE — Progress Notes (Signed)
Recreation Therapy Notes  Date: 09.07.2016 Time: 9:30am Location: [address] Group Room   Group Topic: Stress Management  Goal Area(s) Addresses:  Patient will actively participate in stress management techniques presented during session.   Behavioral Response: Passively Engaged   Intervention: Stress management techniques  Activity :  Deep Breathing and Progressive Muscle Relaxation. LRT provided instruction and demonstration on practice of Progressive Muscle Relaxation. Technique was coupled with deep breathing.   Education:  Stress Management, Discharge Planning.   Education Outcome: Acknowledges education  Clinical Observations/Feedback: Patient participated in activity, but was observed to engage in side conversations with peers and only invest in session for approximately 50% of session.    Rodney Fernandez Rodney Fernandez, LRT/CTRS  Mung Rinker L 09/05/2015 2:52 PM

## 2015-09-05 NOTE — [facility] Group Notes (Signed)
[facility] LCSW Group Therapy 09/05/2015  1:15 PM   Type of Therapy: Group Therapy  Participation Level: Did Not Attend. Patient invited to participate but declined.   Katielynn Horan, MSW, LCSWA Clinical Social Worker [facility] [phone]   

## 2015-09-06 DIAGNOSIS — F419 Anxiety disorder, unspecified: Secondary | ICD-10-CM

## 2015-09-06 MED ORDER — CHLORDIAZEPOXIDE HCL 25 MG PO CAPS
25.0000 mg | ORAL_CAPSULE | Freq: Every day | ORAL | Status: DC
  Administered 2015-09-06 – 2015-09-11 (×6): 25 mg via ORAL
  Filled 2015-09-06 (×6): qty 1

## 2015-09-06 NOTE — Progress Notes (Signed)
[facility] MD Progress Note  09/06/2015 5:46 PM Rodney Fernandez  MRN:  [id number] Subjective:  Rodney Fernandez is still endorsing shakes and diaphoresis.  Per nursing, his detox protocol timed out.  His mood is somewhat better but still states that he is feeling depressed. Understands that  things are changing for him. He is still dealing with the loss of the relationship with his wife and the possible loss in the relationship with his son. He wants to get better. He heard that his employer is willing to work with him. So this has decreased his stress level as he can go for further treatment. He wants to develop better coping strategies.   Principal Problem: Severe recurrent major depression without psychotic features Diagnosis:   Patient Active Problem List   Diagnosis Date Noted  . Severe recurrent major depression without psychotic features [F33.2] 09/01/2015  . Alcohol use disorder, severe, dependence [F10.20] 09/01/2015  . Severe alcohol use disorder [F10.99] 09/01/2015   Total Time spent with patient: 30 minutes   Past Medical History:  Past Medical History  Diagnosis Date  . Pneumothorax 2014    Past Surgical History  Procedure Laterality Date  . Orthopedic surgery  2014    Metal placement in rght hand and left shoulder   Family History: History reviewed. No pertinent family history. Social History:  History  Alcohol Use  . Yes     History  Drug Use  . Yes  . Special: Cocaine, Marijuana    Social History   Social History  . Marital Status: Single    Spouse Name: N/A  . Number of Children: N/A  . Years of Education: N/A   Social History Main Topics  . Smoking status: Current Every Day Smoker  . Smokeless tobacco: None  . Alcohol Use: Yes  . Drug Use: Yes    Special: Cocaine, Marijuana  . Sexual Activity: Yes   Other Topics Concern  . None   Social History Narrative   Additional History:    Sleep: Fair  Appetite:  Fair   Assessment:   Musculoskeletal: Strength &  Muscle Tone: within normal limits Gait & Station: normal Patient leans: normal   Psychiatric Specialty Exam: Physical Exam  Vitals reviewed.   Review of Systems  Constitutional: Negative.   HENT: Negative.   Eyes: Negative.   Respiratory: Negative.   Cardiovascular: Negative.   Gastrointestinal: Negative.   Genitourinary: Negative.   Musculoskeletal: Negative.   Skin: Negative.   Neurological: Negative.   Endo/Heme/Allergies: Negative.   Psychiatric/Behavioral: Positive for depression and substance abuse. The patient is nervous/anxious.     Blood pressure 106/79, pulse 96, temperature 98 F (36.7 C), temperature source Oral, resp. rate 16, SpO2 99 %.There is no weight on file to calculate BMI.  General Appearance: Fairly Groomed  Patent attorney::  Fair  Speech:  Clear and Coherent  Volume:  Normal  Mood:  Anxious and Depressed  Affect:  Restricted  Thought Process:  Coherent and Goal Directed  Orientation:  Full (Time, Place, and Person)  Thought Content:  symptoms events worries concerns  Suicidal Thoughts:  No  Homicidal Thoughts:  No  Memory:  Immediate;   Fair Recent;   Fair Remote;   Fair  Judgement:  Fair  Insight:  Present and Shallow  Psychomotor Activity:  Restlessness  Concentration:  Fair  Recall:  [employer] of Knowledge:Fair  Language: Fair  Akathisia:  No  Handed:  Right  AIMS (if indicated):     Assets:  Desire for Improvement  ADL's:  Intact  Cognition: WNL  Sleep:  Number of Hours: 6.75     Current Medications: Current Facility-Administered Medications  Medication Dose Route Frequency Provider Last Rate Last Dose  . acetaminophen (TYLENOL) tablet 650 mg  650 mg Oral Q6H PRN Earney Navy, NP      . alum & mag hydroxide-simeth (MAALOX/MYLANTA) 200-200-20 MG/5ML suspension 30 mL  30 mL Oral Q4H PRN Earney Navy, NP      . chlordiazePOXIDE (LIBRIUM) capsule 25 mg  25 mg Oral Daily Adonis Brook, NP   25 mg at 09/06/15 1702  .  dicyclomine (BENTYL) tablet 20 mg  20 mg Oral QID PRN Adonis Brook, NP   20 mg at 09/02/15 1723  . escitalopram (LEXAPRO) tablet 10 mg  10 mg Oral Daily Rachael Fee, MD   10 mg at 09/06/15 [phone]  . feeding supplement (BOOST / RESOURCE BREEZE) liquid 1 Container  1 Container Oral BID BM Renie Ora, RD   1 Container at 09/06/15 1400  . gabapentin (NEURONTIN) capsule 100 mg  100 mg Oral TID Rachael Fee, MD   100 mg at 09/06/15 1702  . magnesium hydroxide (MILK OF MAGNESIA) suspension 30 mL  30 mL Oral Daily PRN Earney Navy, NP      . mirtazapine (REMERON) tablet 15 mg  15 mg Oral QHS Rachael Fee, MD   15 mg at 09/05/15 2152  . multivitamin with minerals tablet 1 tablet  1 tablet Oral Daily Earney Navy, NP   1 tablet at 09/06/15 0738  . nicotine polacrilex (NICORETTE) gum 2 mg  2 mg Oral PRN Rachael Fee, MD   2 mg at 09/06/15 0740  . thiamine (VITAMIN B-1) tablet 100 mg  100 mg Oral Daily Earney Navy, NP   100 mg at 09/06/15 9604    Lab Results: No results found for this or any previous visit (from the past 48 hour(s)).  Physical Findings: AIMS: Facial and Oral Movements Muscles of Facial Expression: None, normal Lips and Perioral Area: None, normal Jaw: None, normal Tongue: None, normal,Extremity Movements Upper (arms, wrists, hands, fingers): None, normal Lower (legs, knees, ankles, toes): None, normal, Trunk Movements Neck, shoulders, hips: None, normal, Overall Severity Severity of abnormal movements (highest score from questions above): None, normal Incapacitation due to abnormal movements: None, normal Patient's awareness of abnormal movements (rate only patient's report): No Awareness, Dental Status Current problems with teeth and/or dentures?: No Does patient usually wear dentures?: No  CIWA:  CIWA-Ar Total: 1 COWS:     Treatment Plan Summary: Daily contact with patient to assess and evaluate symptoms and progress in treatment and Medication  management Supportive approach/coping skills Depression; will continue to work with the Lexapro 10 mg and consider increasing to 15 depending on tolerability or early response.  Librium for persistent withdrawal symptoms Insomnia; will continue to work with the Remeron 15 mg HS Anxiety; will continue to work with the Neurontin  Use CBT/mindfulness Medical Decision Making:  Review of Psycho-Social Stressors (1) and Review of Medication Regimen & Side Effects (2)  Velna Hatchet May Agustin AGNP-BC 09/06/2015, 5:46 PM I agree with assessment and plan Madie Reno A. Dub Mikes, M.D.

## 2015-09-06 NOTE — Progress Notes (Signed)
Patient ID: Rodney Fernandez, male   DOB: 1981/04/18, 34 y.o.   MRN: [id number] Pt currently presents with a flat affect and anxious behavior. Per self inventory, pt rates depression, hopelessness and anxiety at a 8. Pt's daily goal is to "doing something for my depression" and they intend to do so by "I don't know." Pt reports fair sleep, a fair appetite, low energy and poor concentration. Pt does endorse increased depression and sadness this morning. Pt reports that he typically reads when he is stressed or does soduko puzzles.  Pt provided with medications per providers orders. Pt's labs and vitals were monitored throughout the day. Pt supported emotionally and encouraged to express concerns and questions. Pt educated on medications. Pt offered magazines and puzzles.  Pt's safety ensured with 15 minute and environmental checks. Pt currently denies SI/HI and A/V hallucinations. Pt verbally agrees to seek staff if SI/HI or A/VH occurs and to consult with staff before acting on these thoughts. Will continue POC.

## 2015-09-06 NOTE — [facility] Group Notes (Signed)
[facility] LCSW Group Therapy  09/06/2015 2:32 PM  Type of Therapy:  Group Therapy  Participation Level:  Active  Participation Quality:  Attentive  Affect:  Appropriate  Cognitive:  Alert and Oriented  Insight:  Engaged  Engagement in Therapy:  Improving  Modes of Intervention:  Confrontation, Discussion, Education, Exploration, Problem-solving, Rapport Building, Socialization and Support  Summary of Progress/Problems:  Finding Balance in Life. Today's group focused on defining balance in one's own words, identifying things that can knock one off balance, and exploring healthy ways to maintain balance in life. Group members were asked to provide an example of a time when they felt off balance, describe how they handled that situation,and process healthier ways to regain balance in the future. Group members were asked to share the most important tool for maintaining balance that they learned while at [facility] and how they plan to apply this method after discharge. Rodney Fernandez was attentive and engaged during today's processing group. He shared that he is moving toward balance and is open to a referral to [facility]. He shared that his job is willing to hold his position "until I can get the help I need" and states that his primary motivation is "to be healthy for my 24 year old son." pt brightened when talking about his son and stated that he wants to be reunited with him after getting himself sober and healthy.   Smart, [name] LCSWA  09/06/2015, 2:32 PM

## 2015-09-06 NOTE — Progress Notes (Signed)
D: Pt presents appropriate in affect and pleasant in mood this evening. Pt observed interacting with others appropriately. Pt was visible and active within the milieu. Pt had some difficulty falling asleep after the administration of his mirtazapine. Pt was able to fall asleep without following-up with writer for an additional sleep aid. Pt denies any SI. A: Writer administered scheduled and prn medications to pt, per MD orders. Continued support and availability as needed was extended to this pt. Staff continue to monitor pt with q33min checks.  R: No adverse drug reactions noted. Pt receptive to treatment. Pt remains safe at this time.

## 2015-09-06 NOTE — [facility] Group Notes (Signed)
The focus of this group is to educate the patient on the purpose and policies of crisis stabilization and provide a format to answer questions about their admission.  The group details unit policies and expectations of patients while admitted.  Patient attended 0900 nurse education orientation group this morning.  Patient actively participated and had appropraite affect.  Patient was alert.  Patient had appropriate insight and appropriate engagement.  Today patient will work on 3 goals for discharge.

## 2015-09-07 DIAGNOSIS — F1420 Cocaine dependence, uncomplicated: Secondary | ICD-10-CM | POA: Clinically undetermined

## 2015-09-07 MED ORDER — ESCITALOPRAM OXALATE 20 MG PO TABS
20.0000 mg | ORAL_TABLET | Freq: Every day | ORAL | Status: DC
  Administered 2015-09-08 – 2015-09-11 (×4): 20 mg via ORAL
  Filled 2015-09-07 (×5): qty 1

## 2015-09-07 MED ORDER — INFLUENZA VAC SPLIT QUAD 0.5 ML IM SUSY
0.5000 mL | PREFILLED_SYRINGE | INTRAMUSCULAR | Status: AC
  Administered 2015-09-08: 0.5 mL via INTRAMUSCULAR
  Filled 2015-09-07: qty 0.5

## 2015-09-07 MED ORDER — GABAPENTIN 300 MG PO CAPS
300.0000 mg | ORAL_CAPSULE | Freq: Three times a day (TID) | ORAL | Status: DC
  Administered 2015-09-07 – 2015-09-09 (×6): 300 mg via ORAL
  Filled 2015-09-07 (×11): qty 1

## 2015-09-07 NOTE — [facility] Group Notes (Signed)
[facility] LCSW Aftercare Discharge Planning Group Note   09/07/2015 10:07 AM  Participation Quality:  Invited-DID NOT ATTEND. Pt chose to sleep in room.   Smart, [employer]

## 2015-09-07 NOTE — Progress Notes (Signed)
Patient ID: Rodney Fernandez, male   DOB: 03/29/1981, 34 y.o.   MRN: [id number] Pt currently presents with a flat affect and anxious behavior. Per self inventory, pt rates depression at a 8, hopelessness 8 and anxiety 10. Pt's daily goal is to "getting better" and they intend to do so by "anything." Pt reports fair sleep, a good appetite, low energy and poor concentration. Pt increased anxiety and irritability today.  Pt provided with medications per providers orders. Pt's labs and vitals were monitored throughout the day. Pt supported emotionally and encouraged to express concerns and questions. Pt educated on medications. Per MD and [facility], pt rosary beads kept in medication drawer. Pt allowed to pray with rosary beads with staff monitoring.  Pt's safety ensured with 15 minute and environmental checks. Pt currently denies SI/HI and A/V hallucinations. Pt verbally agrees to seek staff if SI/HI or A/VH occurs and to consult with staff before acting on these thoughts. Will continue POC.

## 2015-09-07 NOTE — Progress Notes (Signed)
[facility] MD Progress Note  09/07/2015 12:26 PM Rodney Fernandez  MRN:  [id number] Subjective:  Rodney Fernandez states " I am still having withdrawal sx . I still feel depressed. I do not remember the names of my medications . I think it should be increased.'  Objective:Patient seen and chart reviewed.Discussed patient with treatment team.  Patient presented with worsening depression as well as alcohol use disorder . He has been abusing alcohol since the age of 48 y , recently his wife left him with his child due to his alcohol abuse. Pt today continues to have restlessness , sweats as well as depression. Pt reports sleep medications as helping him to sleep better than he used to. Per staff - pt continues to have anxiety, restlessness , continues to need support and treatment. No disruptive issues noted on the unit. He is tolerating his medications well with no ADRs.     Principal Problem: Severe recurrent major depression without psychotic features Diagnosis:   Patient Active Problem List   Diagnosis Date Noted  . Cocaine use disorder, moderate, dependence [F14.20] 09/07/2015  . Severe recurrent major depression without psychotic features [F33.2] 09/01/2015  . Alcohol use disorder, severe, dependence [F10.20] 09/01/2015  . Severe alcohol use disorder [F10.99] 09/01/2015   Total Time spent with patient: 30 minutes   Past Medical History:  Past Medical History  Diagnosis Date  . Pneumothorax 2014    Past Surgical History  Procedure Laterality Date  . Orthopedic surgery  2014    Metal placement in rght hand and left shoulder   Family History: Pt did not express any.  Social History:  History  Alcohol Use  . Yes     History  Drug Use  . Yes  . Special: Cocaine, Marijuana    Social History   Social History  . Marital Status: Single    Spouse Name: N/A  . Number of Children: N/A  . Years of Education: N/A   Social History Main Topics  . Smoking status: Current Every Day Smoker  .  Smokeless tobacco: None  . Alcohol Use: Yes  . Drug Use: Yes    Special: Cocaine, Marijuana  . Sexual Activity: Yes   Other Topics Concern  . None   Social History Narrative   Additional History:    Sleep: Fair  Appetite:  Fair    Musculoskeletal: Strength & Muscle Tone: within normal limits Gait & Station: normal Patient leans: normal   Psychiatric Specialty Exam: Physical Exam  Vitals reviewed.   Review of Systems  Constitutional: Negative.   HENT: Negative.   Eyes: Negative.   Respiratory: Negative.   Cardiovascular: Negative.   Gastrointestinal: Negative.   Genitourinary: Negative.   Musculoskeletal: Negative.   Skin: Negative.   Neurological: Positive for tremors.  Endo/Heme/Allergies: Negative.   Psychiatric/Behavioral: Positive for depression and substance abuse. The patient is nervous/anxious.   All other systems reviewed and are negative.   Blood pressure 109/80, pulse 84, temperature 97.9 F (36.6 C), temperature source Oral, resp. rate 16, SpO2 99 %.There is no weight on file to calculate BMI.  General Appearance: Fairly Groomed  Patent attorney::  Fair  Speech:  Clear and Coherent  Volume:  Normal  Mood:  Anxious and Depressed  Affect:  Depressed  Thought Process:  Goal Directed  Orientation:  Full (Time, Place, and Person)  Thought Content:  Rumination and symptoms events worries concerns  Suicidal Thoughts:  No  Homicidal Thoughts:  No  Memory:  Immediate;   Fair  Recent;   Fair Remote;   Fair  Judgement:  Fair  Insight:  Shallow  Psychomotor Activity:  Restlessness  Concentration:  Fair  Recall:  [employer] of Knowledge:Fair  Language: Fair  Akathisia:  No  Handed:  Right  AIMS (if indicated):     Assets:  Desire for Improvement  ADL's:  Intact  Cognition: WNL  Sleep:  Number of Hours: 6.75     Current Medications: Current Facility-Administered Medications  Medication Dose Route Frequency Provider Last Rate Last Dose  .  acetaminophen (TYLENOL) tablet 650 mg  650 mg Oral Q6H PRN Earney Navy, NP      . alum & mag hydroxide-simeth (MAALOX/MYLANTA) 200-200-20 MG/5ML suspension 30 mL  30 mL Oral Q4H PRN Earney Navy, NP      . chlordiazePOXIDE (LIBRIUM) capsule 25 mg  25 mg Oral Daily Adonis Brook, NP   25 mg at 09/07/15 0806  . dicyclomine (BENTYL) tablet 20 mg  20 mg Oral QID PRN Adonis Brook, NP   20 mg at 09/02/15 1723  . [START ON 09/08/2015] escitalopram (LEXAPRO) tablet 20 mg  20 mg Oral Daily Jaclynn Laumann, MD      . feeding supplement (BOOST / RESOURCE BREEZE) liquid 1 Container  1 Container Oral BID BM Renie Ora, RD   1 Container at 09/07/15 1010  . gabapentin (NEURONTIN) capsule 300 mg  300 mg Oral TID Jomarie Longs, MD      . Melene Muller ON 09/08/2015] Influenza vac split quadrivalent PF (FLUARIX) injection 0.5 mL  0.5 mL Intramuscular Tomorrow-1000 Rachael Fee, MD      . magnesium hydroxide (MILK OF MAGNESIA) suspension 30 mL  30 mL Oral Daily PRN Earney Navy, NP      . mirtazapine (REMERON) tablet 15 mg  15 mg Oral QHS Rachael Fee, MD   15 mg at 09/06/15 2118  . multivitamin with minerals tablet 1 tablet  1 tablet Oral Daily Earney Navy, NP   1 tablet at 09/07/15 0806  . nicotine polacrilex (NICORETTE) gum 2 mg  2 mg Oral PRN Rachael Fee, MD   2 mg at 09/07/15 1011  . thiamine (VITAMIN B-1) tablet 100 mg  100 mg Oral Daily Earney Navy, NP   100 mg at 09/07/15 4098    Lab Results: No results found for this or any previous visit (from the past 48 hour(s)).  Physical Findings: AIMS: Facial and Oral Movements Muscles of Facial Expression: None, normal Lips and Perioral Area: None, normal Jaw: None, normal Tongue: None, normal,Extremity Movements Upper (arms, wrists, hands, fingers): None, normal Lower (legs, knees, ankles, toes): None, normal, Trunk Movements Neck, shoulders, hips: None, normal, Overall Severity Severity of abnormal movements (highest  score from questions above): None, normal Incapacitation due to abnormal movements: None, normal Patient's awareness of abnormal movements (rate only patient's report): No Awareness, Dental Status Current problems with teeth and/or dentures?: No Does patient usually wear dentures?: No  CIWA:  CIWA-Ar Total: 1 COWS:      Assessment: Patient with cocaine as well as alcohol use do , presented with worsening depression. Pt continues to have affective sx as well as is in withdrawal . Will continue treatment.   Treatment Plan Summary: Daily contact with patient to assess and evaluate symptoms and progress in treatment and Medication management Supportive approach/coping skills Depression; will increase Lexapro to 20 mg po daily .  Librium for persistent withdrawal symptoms Insomnia; will continue to work  with the Remeron 15 mg HS. Anxiety; will continue to work with the Neurontin . Increase Neurontin to 300 mg po tid . Use CBT/mindfulness Medical Decision Making:  Review of Psycho-Social Stressors (1) and Review of Medication Regimen & Side Effects (2)  Takya Vandivier MD 09/07/2015, 12:26 PM

## 2015-09-07 NOTE — Progress Notes (Signed)
D: Pt presents appropriate in affect and pleasant in mood. Pt continues to have interest in possibly going to [facility] upon discharge. Pt is visible and active within the milieu. Pt is negative for any SI/HI/AVH at this time. Pt encouraged to seek writer if he presents wit difficulty going or remaining asleep. A: Writer administered scheduled and prn medications to pt, per MD orders. Continued support and availability as needed was extended to this pt. Staff continue to monitor pt with q42min checks.  R: No adverse drug reactions noted. Pt receptive to treatment. Pt remains safe at this time.
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
## 2015-09-07 NOTE — [facility] Group Notes (Signed)
[facility] LCSW Group Therapy  09/07/2015 12:48 PM  Type of Therapy:  Group Therapy  Participation Level:  Active  Participation Quality:  Attentive  Affect:  Appropriate  Cognitive:  Alert and Oriented  Insight:  Improving  Engagement in Therapy:  Improving  Modes of Intervention:  Confrontation, Discussion, Education, Exploration, Problem-solving, Rapport Building, Socialization and Support  Summary of Progress/Problems: Feelings around Relapse. Group members discussed the meaning of relapse and shared personal stories of relapse, how it affected them and others, and how they perceived themselves during this time. Group members were encouraged to identify triggers, warning signs and coping skills used when facing the possibility of relapse. Social supports were discussed and explored in detail. Post Acute Withdrawal Syndrome (handout provided) was introduced and examined. Pt's were encouraged to ask questions, talk about key points associated with PAWS, and process this information in terms of relapse prevention. Rodney Fernandez was attentive and engaged during today's processing group. He shared that he needs to practice better self care and give himself a break "I'm hard on myself for messing up." He followed along as the group read the PAWS information and talked out ways to practice self care while in recovery.   Smart, Rodney Fernandez LCSWA  09/07/2015, 12:48 PM
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
## 2015-09-08 NOTE — [facility] Group Notes (Signed)
[facility] Group Notes:  (Clinical Social Work)  09/08/2015     10-11AM  Summary of Progress/Problems:   The main focus of today's process group was to learn how to use a decisional balance exercise to move forward in the Stages of Change, which were described and discussed.  Patients listed needs on the whiteboard and unhealthy coping techniques often used to fill needs.  Motivational Interviewing and the whiteboard were utilized to help patients explore in depth the perceived benefits and costs of unhealthy coping techniques, as well as the  benefits and costs of replacing that with a healthy coping skills.  A handout was distributed for patients to be able to do this exercise for themselves.   The patient expressed that their own unhealthy coping involves "the first seven on the list" especially drugging and drinking and suicidal ideation.  He was very heavily involved in the discussion initially, then left the room and did not return.  He was quite playful during group, but did not become inappropriate with it.  Type of Therapy:  Group Therapy - Process   Participation Level:  Active  Participation Quality:  Attentive and Sharing  Affect:  Anxious  Cognitive:  Oriented  Insight:  Improving  Engagement in Therapy:  Improving  Modes of Intervention:  Education, Motivational Interviewing  Ambrose Mantle, LCSW 09/08/2015, 12:00 PM

## 2015-09-08 NOTE — Progress Notes (Signed)
D: Pt reports that his main concern now is his depression. Pt reports that he will discuss the possibility of a med adjustment for his depression with the psychiatrist. Pt was able to practice his praying ritual with his rosary beads this evening. Pt observed interacting approprietly with others. Pt is negative for any SI/HI/AVH.  A: Writer administered scheduled and prn medications to pt, per MD orders. Continued support and availability as needed was extended to this pt. Staff continue to monitor pt with q92min checks.  R: No adverse drug reactions noted. Pt receptive to treatment. Pt remains safe at this time.

## 2015-09-08 NOTE — [facility] Group Notes (Signed)
[facility] Group Notes:  (Nursing/MHT/Case Management/Adjunct)  Date:  09/08/2015  Time:  0900 am  Type of Therapy:  Psychoeducational Skills  Participation Level:  Active  Participation Quality:  Appropriate and Attentive  Affect:  Appropriate  Cognitive:  Alert and Appropriate  Insight:  Good  Engagement in Group:  Developing/Improving  Modes of Intervention:  Clarification  Summary of Progress/Problems: Patient is concerned about his increasing anxiety over the last three days.  He was an active participant in group.  [place]ranford Mon 09/08/2015, 3:51 PM

## 2015-09-08 NOTE — Progress Notes (Signed)
Patient ID: Rodney Fernandez, male   DOB: [birth year], 34 y.o.   MRN: [id number] [facility] MD Progress Note  09/08/2015 6:46 PM Rodney Fernandez  MRN:  [id number]  Subjective:  Rodney Fernandez states "My anxiety is high. I'm worried about how my discharge is going to be handled on Monday. I still feel down & out"  Objective:Patient seen and chart reviewed. Discussed patient with treatment team. Patient reports high anxiety & worrying, this time about his discharge on Monday. He is encouraged to focus on applying his coping skills in times of distress for the social worker will be handling his discharge Pt reports sleep medications as helping him to sleep better than he used to. No disruptive issues noted on the unit. He is tolerating his medications well with no ADRs.  Principal Problem: Severe recurrent major depression without psychotic features  Diagnosis:   Patient Active Problem List   Diagnosis Date Noted  . Cocaine use disorder, moderate, dependence [F14.20] 09/07/2015  . Severe recurrent major depression without psychotic features [F33.2] 09/01/2015  . Alcohol use disorder, severe, dependence [F10.20] 09/01/2015  . Severe alcohol use disorder [F10.99] 09/01/2015   Total Time spent with patient: 15 minutes  Past Medical History:  Past Medical History  Diagnosis Date  . Pneumothorax 2014    Past Surgical History  Procedure Laterality Date  . Orthopedic surgery  2014    Metal placement in rght hand and left shoulder   Family History: Pt did not express any.  Social History:  History  Alcohol Use  . Yes     History  Drug Use  . Yes  . Special: Cocaine, Marijuana    Social History   Social History  . Marital Status: Single    Spouse Name: N/A  . Number of Children: N/A  . Years of Education: N/A   Social History Main Topics  . Smoking status: Current Every Day Smoker  . Smokeless tobacco: None  . Alcohol Use: Yes  . Drug Use: Yes    Special: Cocaine, Marijuana  . Sexual Activity:  Yes   Other Topics Concern  . None   Social History Narrative   Additional History:    Sleep: Good  Appetite:  Good  Musculoskeletal: Strength & Muscle Tone: within normal limits Gait & Station: normal Patient leans: normal   Psychiatric Specialty Exam: Physical Exam  Vitals reviewed.   Review of Systems  Constitutional: Negative.   HENT: Negative.   Eyes: Negative.   Respiratory: Negative.   Cardiovascular: Negative.   Gastrointestinal: Negative.   Genitourinary: Negative.   Musculoskeletal: Negative.   Skin: Negative.   Neurological: Positive for tremors.  Endo/Heme/Allergies: Negative.   Psychiatric/Behavioral: Positive for depression and substance abuse. The patient is nervous/anxious.   All other systems reviewed and are negative.   Blood pressure 121/80, pulse 77, temperature 97.6 F (36.4 C), temperature source Oral, resp. rate 20, SpO2 99 %.There is no weight on file to calculate BMI.  General Appearance: Fairly Groomed  Patent attorney::  Fair  Speech:  Clear and Coherent  Volume:  Normal  Mood:  Anxious and Depressed  Affect:  Depressed  Thought Process:  Goal Directed  Orientation:  Full (Time, Place, and Person)  Thought Content:  Rumination and symptoms events worries concerns  Suicidal Thoughts:  No  Homicidal Thoughts:  No  Memory:  Immediate;   Fair Recent;   Fair Remote;   Fair  Judgement:  Fair  Insight:  Shallow  Psychomotor Activity:  Restlessness  Concentration:  Fair  Recall:  [employer] of Knowledge:Fair  Language: Fair  Akathisia:  No  Handed:  Right  AIMS (if indicated):     Assets:  Desire for Improvement  ADL's:  Intact  Cognition: WNL  Sleep:  Number of Hours: 6.25   Current Medications: Current Facility-Administered Medications  Medication Dose Route Frequency Provider Last Rate Last Dose  . acetaminophen (TYLENOL) tablet 650 mg  650 mg Oral Q6H PRN Earney Navy, NP      . alum & mag hydroxide-simeth  (MAALOX/MYLANTA) 200-200-20 MG/5ML suspension 30 mL  30 mL Oral Q4H PRN Earney Navy, NP      . chlordiazePOXIDE (LIBRIUM) capsule 25 mg  25 mg Oral Daily Adonis Brook, NP   25 mg at 09/08/15 0752  . dicyclomine (BENTYL) tablet 20 mg  20 mg Oral QID PRN Adonis Brook, NP   20 mg at 09/02/15 1723  . escitalopram (LEXAPRO) tablet 20 mg  20 mg Oral Daily Jomarie Longs, MD   20 mg at 09/08/15 0752  . feeding supplement (BOOST / RESOURCE BREEZE) liquid 1 Container  1 Container Oral BID BM Renie Ora, RD   1 Container at 09/08/15 1515  . gabapentin (NEURONTIN) capsule 300 mg  300 mg Oral TID Jomarie Longs, MD   300 mg at 09/08/15 1640  . magnesium hydroxide (MILK OF MAGNESIA) suspension 30 mL  30 mL Oral Daily PRN Earney Navy, NP      . mirtazapine (REMERON) tablet 15 mg  15 mg Oral QHS Rachael Fee, MD   15 mg at 09/07/15 2128  . multivitamin with minerals tablet 1 tablet  1 tablet Oral Daily Earney Navy, NP   1 tablet at 09/08/15 0752  . nicotine polacrilex (NICORETTE) gum 2 mg  2 mg Oral PRN Rachael Fee, MD   2 mg at 09/08/15 1315  . thiamine (VITAMIN B-1) tablet 100 mg  100 mg Oral Daily Earney Navy, NP   100 mg at 09/08/15 1610    Lab Results: No results found for this or any previous visit (from the past 48 hour(s)).  Physical Findings: AIMS: Facial and Oral Movements Muscles of Facial Expression: None, normal Lips and Perioral Area: None, normal Jaw: None, normal Tongue: None, normal,Extremity Movements Upper (arms, wrists, hands, fingers): None, normal Lower (legs, knees, ankles, toes): None, normal, Trunk Movements Neck, shoulders, hips: None, normal, Overall Severity Severity of abnormal movements (highest score from questions above): None, normal Incapacitation due to abnormal movements: None, normal Patient's awareness of abnormal movements (rate only patient's report): No Awareness, Dental Status Current problems with teeth and/or dentures?:  No Does patient usually wear dentures?: No  CIWA:  CIWA-Ar Total: 1 COWS:     Assessment:Severe recurrent major depression without psychotic features, Severe alcohol use disorder, Cocaine use disorder, moderate, dependence.  Treatment Plan Summary: Daily contact with patient to assess and evaluate symptoms and progress in treatment and Medication management Supportive approach/coping skills Depression; will continue Lexapro to 20 mg po daily .  Librium for persistent withdrawal symptoms Insomnia; will continue to work with the Remeron 15 mg HS. Anxiety; will continue to work with the Neurontin 300 mg po tid . Use CBT/mindfulness  Medical Decision Making:  Review of Psycho-Social Stressors (1) and Review of Medication Regimen & Side Effects (2)  Sanjuana Kava, PMHNP, FNP-BC 09/08/2015, 6:46 PM  Reviewed the information documented and agree with the treatment plan.  Aaidyn San,JANARDHAHA R. 09/09/2015 4:40 PM
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
## 2015-09-09 MED ORDER — HYDROXYZINE HCL 25 MG PO TABS
25.0000 mg | ORAL_TABLET | Freq: Once | ORAL | Status: AC
  Administered 2015-09-09: 25 mg via ORAL
  Filled 2015-09-09 (×2): qty 1

## 2015-09-09 MED ORDER — GABAPENTIN 400 MG PO CAPS
400.0000 mg | ORAL_CAPSULE | Freq: Three times a day (TID) | ORAL | Status: DC
  Administered 2015-09-09 – 2015-09-11 (×7): 400 mg via ORAL
  Filled 2015-09-09 (×14): qty 1

## 2015-09-09 NOTE — Progress Notes (Signed)
Patient did attend the evening speaker AA meeting.  

## 2015-09-09 NOTE — Progress Notes (Signed)
D: Pt presents anxious today. Pt rates depression 1/10. Hopeless 3/10. Anxiety 7/10. Pt denies suicidal thoughts. Pt denies any withdrawal symptoms. Pt reports fair sleep and appetite. Pt reported that he's currently waiting for placement at [facility] and will find out on Monday, if a bed is available. Pt compliant with taking meds and attending groups. No adverse reactions to meds verbalized by pt.  A: Medications administered as ordered per MD. Verbal support given. Pt encouraged to attend groups. 15 minute checks performed form for safety. R: Pt receptive to tx.

## 2015-09-09 NOTE — Progress Notes (Signed)
D: Pt at this time is alert and oriented x 4. Pt complained of severe anxiety of 8 on a 0-10 anxiety scale; he states, "a referral was sent to [facility] and I was told that I wouldn't know if I was accepted or not until Monday; what will happen if I get discharged before Monday; my baby mama has not let me talk to my son for 2 weeks; I don't know what is going on with him; All these is making me very anxious. Pt however denies depression, SI/HI/AVH and pain. Pt continues to be pleasant and cooperative.  A: Medications administered as prescribed. Support, encouragement, and safe environment provided.   R: Pt was med compliant.  15-minute safety check continues.

## 2015-09-09 NOTE — Progress Notes (Signed)
Adult Psychoeducational Group Note  Date:  09/09/2015 Time:  0900  Group Topic/Focus:  Goals Group:   The focus of this group is to help patients establish daily goals to achieve during treatment and discuss how the patient can incorporate goal setting into their daily lives to aide in recovery.  Participation Level:  Active  Participation Quality:  Appropriate  Affect:  Appropriate  Cognitive:  Appropriate  Insight: Appropriate  Engagement in Group:  Engaged  Modes of Intervention:  Discussion and Education  Additional Comments:    Zylon Creamer L 09/09/2015, 12:39 PM

## 2015-09-09 NOTE — [facility] Group Notes (Signed)
[facility] Group Notes:  (Clinical Social Work)  09/09/2015  10:00-11:00AM  Summary of Progress/Problems:   The main focus of today's process group was to   1)  discuss the importance of adding supports  2)  define health supports versus unhealthy supports  3)  identify the patient's current unhealthy supports and plan how to handle them  4)  Identify the patient's current healthy supports and plan what to add.  An emphasis was placed on using counselor, doctor, therapy groups, 12-step groups, and problem-specific support groups to expand supports.    The patient expressed full comprehension of the concepts presented, and agreed that there is a need to add more supports.  The patient stated he was kicked out of his first AA meeting for pouring alcohol in the coffee, "and everybody relapsed that night."  While he made inappropriate jokes at times, at other times he had appropriate ideas and concerns about adding supports.  Type of Therapy:  Process Group with Motivational Interviewing  Participation Level:  Active  Participation Quality:  Attentive and Sharing  Affect:  Appropriate  Cognitive:  Alert  Insight:  Developing/Improving  Engagement in Therapy:  Engaged  Modes of Intervention:   Education, Support and Processing, Activity  Ambrose Mantle, LCSW 09/09/2015

## 2015-09-09 NOTE — Progress Notes (Signed)
Patient ID: Rodney Fernandez, male   DOB: 1981-07-14, 34 y.o.   MRN: [id number] Patient ID: Rodney Fernandez, male   DOB: [birth year], 34 y.o.   MRN: [id number] [facility] MD Progress Note  09/09/2015 4:19 PM Rodney Fernandez  MRN:  [id number]  Subjective:  Rodney Fernandez states "My anxiety is high. It was really bad during group sessions this morning. Also, being around people makes even mor anxious"  Objective:Patient seen and chart reviewed. Discussed patient with treatment team. Patient reports high anxiety level & worrying, this time about being anxious when around people . He is encouraged to focus on applying his coping skills in times of distress. Pt reports sleep medications as helping him to sleep better than he used to. No disruptive issues noted on the unit. He is tolerating his medications well with no ADRs.  Principal Problem: Severe recurrent major depression without psychotic features  Diagnosis:   Patient Active Problem List   Diagnosis Date Noted  . Cocaine use disorder, moderate, dependence [F14.20] 09/07/2015  . Severe recurrent major depression without psychotic features [F33.2] 09/01/2015  . Alcohol use disorder, severe, dependence [F10.20] 09/01/2015  . Severe alcohol use disorder [F10.99] 09/01/2015   Total Time spent with patient: 15 minutes  Past Medical History:  Past Medical History  Diagnosis Date  . Pneumothorax 2014    Past Surgical History  Procedure Laterality Date  . Orthopedic surgery  2014    Metal placement in rght hand and left shoulder   Family History: Pt did not express any.  Social History:  History  Alcohol Use  . Yes     History  Drug Use  . Yes  . Special: Cocaine, Marijuana    Social History   Social History  . Marital Status: Single    Spouse Name: N/A  . Number of Children: N/A  . Years of Education: N/A   Social History Main Topics  . Smoking status: Current Every Day Smoker  . Smokeless tobacco: None  . Alcohol Use: Yes  . Drug Use: Yes   Special: Cocaine, Marijuana  . Sexual Activity: Yes   Other Topics Concern  . None   Social History Narrative   Additional History:    Sleep: Good  Appetite:  Good  Musculoskeletal: Strength & Muscle Tone: within normal limits Gait & Station: normal Patient leans: normal   Psychiatric Specialty Exam: Physical Exam  Vitals reviewed.   Review of Systems  Constitutional: Negative.   HENT: Negative.   Eyes: Negative.   Respiratory: Negative.   Cardiovascular: Negative.   Gastrointestinal: Negative.   Genitourinary: Negative.   Musculoskeletal: Negative.   Skin: Negative.   Neurological: Positive for tremors.  Endo/Heme/Allergies: Negative.   Psychiatric/Behavioral: Positive for depression and substance abuse. The patient is nervous/anxious.   All other systems reviewed and are negative.   Blood pressure 107/83, pulse 83, temperature 97.7 F (36.5 C), temperature source Oral, resp. rate 18, SpO2 99 %.There is no weight on file to calculate BMI.  General Appearance: Fairly Groomed  Patent attorney::  Fair  Speech:  Clear and Coherent  Volume:  Normal  Mood:  Anxious and Depressed  Affect:  Depressed  Thought Process:  Goal Directed  Orientation:  Full (Time, Place, and Person)  Thought Content:  Rumination and symptoms events worries concerns  Suicidal Thoughts:  No  Homicidal Thoughts:  No  Memory:  Immediate;   Fair Recent;   Fair Remote;   Fair  Judgement:  Fair  Insight:  Shallow  Psychomotor  Activity:  Restlessness  Concentration:  Fair  Recall:  [employer] of Knowledge:Fair  Language: Fair  Akathisia:  No  Handed:  Right  AIMS (if indicated):     Assets:  Desire for Improvement  ADL's:  Intact  Cognition: WNL  Sleep:  Number of Hours: 6.75   Current Medications: Current Facility-Administered Medications  Medication Dose Route Frequency Provider Last Rate Last Dose  . acetaminophen (TYLENOL) tablet 650 mg  650 mg Oral Q6H PRN Earney Navy, NP       . alum & mag hydroxide-simeth (MAALOX/MYLANTA) 200-200-20 MG/5ML suspension 30 mL  30 mL Oral Q4H PRN Earney Navy, NP      . chlordiazePOXIDE (LIBRIUM) capsule 25 mg  25 mg Oral Daily Adonis Brook, NP   25 mg at 09/09/15 0818  . dicyclomine (BENTYL) tablet 20 mg  20 mg Oral QID PRN Adonis Brook, NP   20 mg at 09/02/15 1723  . escitalopram (LEXAPRO) tablet 20 mg  20 mg Oral Daily Jomarie Longs, MD   20 mg at 09/09/15 0818  . feeding supplement (BOOST / RESOURCE BREEZE) liquid 1 Container  1 Container Oral BID BM Renie Ora, RD   1 Container at 09/09/15 1302  . gabapentin (NEURONTIN) capsule 300 mg  300 mg Oral TID Jomarie Longs, MD   300 mg at 09/09/15 1121  . magnesium hydroxide (MILK OF MAGNESIA) suspension 30 mL  30 mL Oral Daily PRN Earney Navy, NP      . mirtazapine (REMERON) tablet 15 mg  15 mg Oral QHS Rachael Fee, MD   15 mg at 09/08/15 2138  . multivitamin with minerals tablet 1 tablet  1 tablet Oral Daily Earney Navy, NP   1 tablet at 09/09/15 0818  . nicotine polacrilex (NICORETTE) gum 2 mg  2 mg Oral PRN Rachael Fee, MD   2 mg at 09/09/15 1301  . thiamine (VITAMIN B-1) tablet 100 mg  100 mg Oral Daily Earney Navy, NP   100 mg at 09/09/15 0818    Lab Results: No results found for this or any previous visit (from the past 48 hour(s)).  Physical Findings: AIMS: Facial and Oral Movements Muscles of Facial Expression: None, normal Lips and Perioral Area: None, normal Jaw: None, normal Tongue: None, normal,Extremity Movements Upper (arms, wrists, hands, fingers): None, normal Lower (legs, knees, ankles, toes): None, normal, Trunk Movements Neck, shoulders, hips: None, normal, Overall Severity Severity of abnormal movements (highest score from questions above): None, normal Incapacitation due to abnormal movements: None, normal Patient's awareness of abnormal movements (rate only patient's report): No Awareness, Dental Status Current  problems with teeth and/or dentures?: No Does patient usually wear dentures?: No  CIWA:  CIWA-Ar Total: 1 COWS:     Assessment:Severe recurrent major depression without psychotic features, Severe alcohol use disorder, Cocaine use disorder, moderate, dependence.  Treatment Plan Summary: Daily contact with patient to assess and evaluate symptoms and progress in treatment and Medication management Supportive approach/coping skills reinforcement Depression; will continue Lexapro to 20 mg po daily .  Librium for persistent withdrawal symptoms Insomnia; will continue to work with the Remeron 15 mg HS. Anxiety; will continue to work with & increase the Neurontin to 400 mg po qid . Use CBT/mindfulness. Discharge plan in progress.  Medical Decision Making:  Review of Psycho-Social Stressors (1) and Review of Medication Regimen & Side Effects (2)  Sanjuana Kava, PMHNP, FNP-BC 09/09/2015, 4:19 PM  Reviewed the information  documented and agree with the treatment plan.  Admire Bunnell,JANARDHAHA R. 09/09/2015 4:48 PM

## 2015-09-10 LAB — TSH: TSH: 1.835 u[IU]/mL (ref 0.350–4.500)

## 2015-09-10 MED ORDER — ACAMPROSATE CALCIUM 333 MG PO TBEC
666.0000 mg | DELAYED_RELEASE_TABLET | Freq: Three times a day (TID) | ORAL | Status: DC
  Administered 2015-09-10 – 2015-09-11 (×3): 666 mg via ORAL
  Filled 2015-09-10 (×8): qty 2

## 2015-09-10 NOTE — Progress Notes (Signed)
Recreation Therapy Notes  Date: 09.12.2016 Time: 9:30am Location: [address] Group room   Group Topic: Stress Management  Goal Area(s) Addresses:  Patient will actively participate in stress management techniques presented during session.   Behavioral Response: Did not attend.   Marykay Lex Rami Budhu, LRT/CTRS  Shaquna Geigle L 09/10/2015 11:40 AM

## 2015-09-10 NOTE — Progress Notes (Signed)
D: Patient alert and oriented x 4. Patient denies pain/SI/HI/AVH. Patient reports he is having anxiety 7 out of 10. Telephone order received for 25 mg Vistaril tab po one time at 9pm. Patient was worried about money in bank that became missing. Patient voiced he called bank and had debit card canceled and a freeze placed on account.  A: Staff to monitor Q 15 mins for safety. Encouragement and support offered. Scheduled medications administered per orders. R: Patient remains safe on the unit. Patient attended group tonight. Patient visible on the unit and interacting with peers. Patient taking administered medications.

## 2015-09-10 NOTE — Progress Notes (Signed)
D: Pt presents anxious on approach. Pt laughing and engaging with other pts on the unit. Pt reported that he had a difficult time last night after speaking to his wife. Pt reported that he had to ask for vistaril d/t the increased anxiety he was experiencing. Pt rates depression 8/10. Anxiety 7/10. Hopelessness 5/10. Pt denies withdrawal symptoms. Pt currently waiting for placement at a long-term tx facility. Pt hopeful that he will be able to attend tx at [facility]. Pt denies suicidal thoughts. Pt reports good sleep and appetite. Pt compliant with taking meds and attending groups. Pt verbalized that the increase in Neurontin is working well for him. No adverse reactions to meds verbalized by pt. A: Medications administered as ordered per MD. Verbal support given. Pt encouraged to attend groups. 15 minute checks performed for safety.  R: Pt receptive to tx.

## 2015-09-10 NOTE — [facility] Group Notes (Signed)
[facility] LCSW Aftercare Discharge Planning Group Note   09/10/2015 11:40 AM  Participation Quality:  Appropriate   Mood/Affect:  Appropriate  Depression Rating:  1-2  Anxiety Rating:  5  Thoughts of Suicide:  No Will you contract for safety?   NA  Current AVH:  No  Plan for Discharge/Comments:  Pt hoping for [facility] admission. Pending referral. Pt reports good sleep. Minimal withdrawals. Pleasant and calm this morning.   Transportation Means: unknown at this time.   Supports: boss  Smart, Oncologist

## 2015-09-10 NOTE — [facility] Group Notes (Signed)
[facility] LCSW Group Therapy  09/10/2015 12:40 PM  Type of Therapy:  Group Therapy  Participation Level:  Active  Participation Quality:  Attentive  Affect:  Appropriate  Cognitive:  Alert and Oriented  Insight:  Improving  Engagement in Therapy:  Improving  Modes of Intervention:  Confrontation, Discussion, Education, Exploration, Problem-solving, Rapport Building, Socialization and Support  Summary of Progress/Problems: Today's Topic: Overcoming Obstacles. Patients identified one short term goal and potential obstacles in reaching this goal. Patients processed barriers involved in overcoming these obstacles. Patients identified steps necessary for overcoming these obstacles and explored motivation (internal and external) for facing these difficulties head on. Cosmo was attentive and engaged during today's processing group. He shared that his biggest obstacle is getting placed in treatment. "I want to get into [facility] and get myself right before I get back to work and life." hardie was able to brainstorm and plan for alternative d/c options if not accepted into [facility] prior to d/c.   Smart, Lynnleigh Soden LCSWA  09/10/2015, 12:40 PM

## 2015-09-10 NOTE — Progress Notes (Signed)
[facility] MD Progress Note  09/10/2015 5:21 PM Vonte Rossin  MRN:  [id number] Subjective:  Ilai is states that he is dealing with acceptance that the relationship with his wife is over. He states he is just fighting to be able to have a relationship with his son. He states he is committed to abstinence but feels he needs more help. States that if was out there right now dealing with all that is going on he would have relapsed by now. States he is ashamed to admit to it but this is his reality. And he is very concerned. The support from his employer is the only thing he says he is having if his favor Principal Problem: Severe recurrent major depression without psychotic features Diagnosis:   Patient Active Problem List   Diagnosis Date Noted  . Cocaine use disorder, moderate, dependence [F14.20] 09/07/2015  . Severe recurrent major depression without psychotic features [F33.2] 09/01/2015  . Alcohol use disorder, severe, dependence [F10.20] 09/01/2015  . Severe alcohol use disorder [F10.99] 09/01/2015   Total Time spent with patient: 30 minutes   Past Medical History:  Past Medical History  Diagnosis Date  . Pneumothorax 2014    Past Surgical History  Procedure Laterality Date  . Orthopedic surgery  2014    Metal placement in rght hand and left shoulder   Family History: History reviewed. No pertinent family history. Social History:  History  Alcohol Use  . Yes     History  Drug Use  . Yes  . Special: Cocaine, Marijuana    Social History   Social History  . Marital Status: Single    Spouse Name: N/A  . Number of Children: N/A  . Years of Education: N/A   Social History Main Topics  . Smoking status: Current Every Day Smoker  . Smokeless tobacco: None  . Alcohol Use: Yes  . Drug Use: Yes    Special: Cocaine, Marijuana  . Sexual Activity: Yes   Other Topics Concern  . None   Social History Narrative   Additional History:    Sleep: Fair  Appetite:   Fair   Assessment:   Musculoskeletal: Strength & Muscle Tone: within normal limits Gait & Station: normal Patient leans: normal   Psychiatric Specialty Exam: Physical Exam  Review of Systems  Constitutional: Negative.   HENT: Negative.   Eyes: Negative.   Respiratory: Negative.   Cardiovascular: Negative.   Gastrointestinal: Negative.   Genitourinary: Negative.   Musculoskeletal: Negative.   Skin: Negative.   Neurological: Negative.   Endo/Heme/Allergies: Negative.   Psychiatric/Behavioral: Positive for depression and substance abuse. The patient is nervous/anxious.     Blood pressure 120/90, pulse 85, temperature 97.7 F (36.5 C), temperature source Oral, resp. rate 20, SpO2 99 %.There is no weight on file to calculate BMI.  General Appearance: Fairly Groomed  Patent attorney::  Fair  Speech:  Clear and Coherent  Volume:  Normal  Mood:  Anxious worried   Affect:  anxious worried  Thought Process:  Coherent and Goal Directed  Orientation:  Full (Time, Place, and Person)  Thought Content:  symptoms events worries concerns  Suicidal Thoughts:  No  Homicidal Thoughts:  No  Memory:  Immediate;   Fair Recent;   Fair Remote;   Fair  Judgement:  Fair  Insight:  Present  Psychomotor Activity:  Restlessness  Concentration:  Fair  Recall:  [employer] of Knowledge:Fair  Language: Fair  Akathisia:  No  Handed:  Right  AIMS (if  indicated):     Assets:  Desire for Improvement Vocational/Educational  ADL's:  Intact  Cognition: WNL  Sleep:  Number of Hours: 6.5     Current Medications: Current Facility-Administered Medications  Medication Dose Route Frequency Provider Last Rate Last Dose  . acamprosate (CAMPRAL) tablet 666 mg  666 mg Oral TID WC Rachael Fee, MD   666 mg at 09/10/15 1701  . acetaminophen (TYLENOL) tablet 650 mg  650 mg Oral Q6H PRN Earney Navy, NP      . alum & mag hydroxide-simeth (MAALOX/MYLANTA) 200-200-20 MG/5ML suspension 30 mL  30 mL Oral  Q4H PRN Earney Navy, NP      . chlordiazePOXIDE (LIBRIUM) capsule 25 mg  25 mg Oral Daily Adonis Brook, NP   25 mg at 09/10/15 0809  . dicyclomine (BENTYL) tablet 20 mg  20 mg Oral QID PRN Adonis Brook, NP   20 mg at 09/02/15 1723  . escitalopram (LEXAPRO) tablet 20 mg  20 mg Oral Daily Jomarie Longs, MD   20 mg at 09/10/15 0809  . feeding supplement (BOOST / RESOURCE BREEZE) liquid 1 Container  1 Container Oral BID BM Renie Ora, RD   1 Container at 09/10/15 1454  . gabapentin (NEURONTIN) capsule 400 mg  400 mg Oral TID WC & HS Sanjuana Kava, NP   400 mg at 09/10/15 1701  . magnesium hydroxide (MILK OF MAGNESIA) suspension 30 mL  30 mL Oral Daily PRN Earney Navy, NP      . mirtazapine (REMERON) tablet 15 mg  15 mg Oral QHS Rachael Fee, MD   15 mg at 09/09/15 2124  . multivitamin with minerals tablet 1 tablet  1 tablet Oral Daily Earney Navy, NP   1 tablet at 09/10/15 0809  . nicotine polacrilex (NICORETTE) gum 2 mg  2 mg Oral PRN Rachael Fee, MD   2 mg at 09/10/15 1252  . thiamine (VITAMIN B-1) tablet 100 mg  100 mg Oral Daily Earney Navy, NP   100 mg at 09/10/15 1610    Lab Results: No results found for this or any previous visit (from the past 48 hour(s)).  Physical Findings: AIMS: Facial and Oral Movements Muscles of Facial Expression: None, normal Lips and Perioral Area: None, normal Jaw: None, normal Tongue: None, normal,Extremity Movements Upper (arms, wrists, hands, fingers): None, normal Lower (legs, knees, ankles, toes): None, normal, Trunk Movements Neck, shoulders, hips: None, normal, Overall Severity Severity of abnormal movements (highest score from questions above): None, normal Incapacitation due to abnormal movements: None, normal Patient's awareness of abnormal movements (rate only patient's report): No Awareness, Dental Status Current problems with teeth and/or dentures?: No Does patient usually wear dentures?: No  CIWA:   CIWA-Ar Total: 1 COWS:     Treatment Plan Summary: Daily contact with patient to assess and evaluate symptoms and progress in treatment and Medication management Supportive approach/coping skills Alcohol cocaine dependence; continue to work a relapse prevention plan Depression; continue to work with Lexapro Insomnia; continue work with Remeron  Mood instability; work with the Neurontin  CBT/mindfulness Explore other residential treatment options Medical Decision Making:  Review of Psycho-Social Stressors (1) and Review of Medication Regimen & Side Effects (2)     Zarius Furr A 09/10/2015, 5:21 PM

## 2015-09-11 MED ORDER — ACAMPROSATE CALCIUM 333 MG PO TBEC: 666.0000 mg | DELAYED_RELEASE_TABLET | Freq: Three times a day (TID) | ORAL | Status: DC

## 2015-09-11 MED ORDER — ESCITALOPRAM OXALATE 20 MG PO TABS: 20.0000 mg | ORAL_TABLET | Freq: Every day | ORAL | Status: DC

## 2015-09-11 MED ORDER — MIRTAZAPINE 15 MG PO TABS: 15.0000 mg | ORAL_TABLET | Freq: Every day | ORAL | Status: DC

## 2015-09-11 MED ORDER — GABAPENTIN 400 MG PO CAPS: ORAL_CAPSULE | ORAL | Status: DC

## 2015-09-11 NOTE — Progress Notes (Signed)
  [facility] Adult Case Management Discharge Plan :  Will you be returning to the same living situation after discharge: No-pt accepted to [facility].  At discharge, do you have transportation home?: Yes,  [facility] transport at 11AM Do you have the ability to pay for your medications: Yes,  cardinal medicaid  Release of information consent forms completed and submitted to medical records by CSW.  Patient to Follow up at: Follow-up Information    Follow up with [facility].   Specialty:  Behavioral Health   Why:  Walk-in clinic Monday-Friday between 8am to 3pm for assessment for therapy and medication management services.    Contact information:   [address] [place] [address] [phone]       Follow up with [facility] On 09/11/2015.   Why:  You will be picked up by [facility] transport at 11:00AM on this date. Make sure that you have all of your medications prior to leaving the hospital.    Contact information:   [address] [place], [place] [address] Phone: [phone] Fax: [phone]      Patient denies SI/HI: Yes,  during group/self report.     Safety Planning and Suicide Prevention discussed: Yes,  SPE completed with pt, as he did not consent to family contact.  Have you used any form of tobacco in the last 30 days? (Cigarettes, Smokeless Tobacco, Cigars, and/or Pipes): Yes  Has patient been referred to the Quitline?: Patient refused referral  Rodney Fernandez, Rodney Fernandez  09/11/2015, 10:23 AM

## 2015-09-11 NOTE — Discharge Summary (Signed)
Physician Discharge Summary Note  Patient:  Rodney Fernandez is an 34 y.o., male MRN:  [id number] DOB:  Apr 30, 1981 Patient phone:  [phone] (home)  Patient address:   [address]  [place] [address],  Total Time spent with patient: Greater than 30 minutes  Date of Admission:  09/01/2015  Date of Discharge: 09-11-15  Reason for Admission:  Cocaine/alcohol use disorder, Mdd, recurrent episodes.  Principal Problem: Severe recurrent major depression without psychotic features  Discharge Diagnoses: Patient Active Problem List   Diagnosis Date Noted  . Cocaine use disorder, moderate, dependence [F14.20] 09/07/2015  . Severe recurrent major depression without psychotic features [F33.2] 09/01/2015  . Alcohol use disorder, severe, dependence [F10.20] 09/01/2015  . Severe alcohol use disorder [F10.99] 09/01/2015   Musculoskeletal: Strength & Muscle Tone: within normal limits Gait & Station: normal Patient leans: N/A  Psychiatric Specialty Exam: Physical Exam  Psychiatric: His speech is normal and behavior is normal. Judgment and thought content normal. His mood appears not anxious. His affect is not angry, not blunt, not labile and not inappropriate. Cognition and memory are normal. He does not exhibit a depressed mood.    Review of Systems  Constitutional: Negative.   HENT: Negative.   Eyes: Negative.   Respiratory: Negative.   Cardiovascular: Negative.   Gastrointestinal: Negative.   Genitourinary: Negative.   Musculoskeletal: Negative.   Skin: Negative.   Neurological: Negative.   Endo/Heme/Allergies: Negative.   Psychiatric/Behavioral: Positive for depression (Stable) and substance abuse (Alcohol/cocaine dependence). Negative for suicidal ideas, hallucinations and memory loss. The patient has insomnia (Stable). The patient is not nervous/anxious.     Blood pressure 109/97, pulse 93, temperature 97.6 F (36.4 C), temperature source Oral, resp. rate 18, SpO2 99 %.There is no  weight on file to calculate BMI.  See Md's SRA   Have you used any form of tobacco in the last 30 days? (Cigarettes, Smokeless Tobacco, Cigars, and/or Pipes): Yes  Has this patient used any form of tobacco in the last 30 days? (Cigarettes, Smokeless Tobacco, Cigars, and/or Pipes) Yes, A prescription for an FDA-approved tobacco cessation medication was offered at discharge and the patient refused  Past Medical History:  Past Medical History  Diagnosis Date  . Pneumothorax 2014    Past Surgical History  Procedure Laterality Date  . Orthopedic surgery  2014    Metal placement in rght hand and left shoulder   Family History: History reviewed. No pertinent family history.  Social History:  History  Alcohol Use  . Yes     History  Drug Use  . Yes  . Special: Cocaine, Marijuana    Social History   Social History  . Marital Status: Single    Spouse Name: N/A  . Number of Children: N/A  . Years of Education: N/A   Social History Main Topics  . Smoking status: Current Every Day Smoker  . Smokeless tobacco: None  . Alcohol Use: Yes  . Drug Use: Yes    Special: Cocaine, Marijuana  . Sexual Activity: Yes   Other Topics Concern  . None   Social History Narrative   Risk to Self: Is patient at risk for suicide?: Yes What has been your use of drugs/alcohol within the last 12 months?: Alcohol - 12-18 beers daily for years, Marijuana use once per week, Cocaine 1-2 times per week  Risk to Others: No Prior Inpatient Therapy: Yes Prior Outpatient Therapy: Yes  Level of Care:  OP  Hospital Course:  Rodney Fernandez, 34 year  old Caucasian Male with a diagnosis of Alcohol Use Disorder, severe, Major Depressive Disorder, recurrent, severe. Patient lives in [place] alone. He recently broke up with his wife and she left with their child due to his ETOH abuse. He does work and he states that this in the one positive thing in his life. Pt reports that he has been living in a motel in  [place] but doesn't want to return there due to it being a poor environment. Pt is open to resources for sober living. He has been on Prozac for 8 months but does not feel it worked for him. UDS positive for cocaine. States he has hx of depression since he was 34 years old.  Rodney Fernandez was admitted to the hospital with a blood alcohol level of 120 per toxicology tests reports and his UDS test reports was positive for cocaine. He was also complaining of worsening symptoms of depression as he believed his current medications were not controlling his symptoms. He required alcohol detoxification as well as mood stabilization treatments. His detoxification treatments was achieved using Librium detox protocols.  Besides the detoxification treatments, Rodney Fernandez was also medicated and discharged on; Campral 666 mg for alcoholism, Lexapro 20 mg for depression, Gabapentin 400 mg for agitation & Mirtazapine 15 mg for depression/insomnia. He presented no other serious medical issues that required treatments. He tolerated his treatment regimen without any significant adverse effects and or reactions reported. Rodney Fernandez was also enrolled and participated in the group counseling sessions and AA/NA meetings being offered and held on this unit. He learned coping skills that should help him cope better after discharge to maintain sobriety.  Rodney Fernandez was motivated for recovery. He worked closely with the treatment team and case managers to develop a discharge plan with appropriate goals to maintain sobriety after discharge. Coping skills, problem solving as well as relaxation therapies were also part of his unit programming. He completed detox treatment and his mood is also stabilized. This is evidenced by his reports of improved mood, absence of suicidal ideations and or withdrawals symptoms. Upon discharge he was in much improved condition than upon admission. His symptoms were reported as significantly decreased or resolved completely.  He currently denies any SIHI, AVH, delusional thoughts, paranoia or substance withdrawal syndrome. He was motivated to continue taking medications with a goal of continued improvement in mental health.  He will follow-up care at the [facility] clinic for medication management/routine psychiatric care & to [facility] for further substance abuse treatments. He was provided with all the pertinent information required to make these appointments without problems. Transportation per [facility]. He received a 14 days worth of his discharge medications from the [facility] pharmacy .   Consults:  psychiatry  Significant Diagnostic Studies:  labs: CBC with diff, CMP, UDS, toxicology tests, U/A, results reviewed, stable  Discharge Vitals:   Blood pressure 109/97, pulse 93, temperature 97.6 F (36.4 C), temperature source Oral, resp. rate 18, SpO2 99 %. There is no weight on file to calculate BMI.  Lab Results:   Results for orders placed or performed during the hospital encounter of 09/01/15 (from the past 72 hour(s))  TSH     Status: None   Collection Time: 09/10/15  7:30 PM  Result Value Ref Range   TSH 1.835 0.350 - 4.500 uIU/mL    Comment: Performed at [facility]    Physical Findings: AIMS: Facial and Oral Movements Muscles of Facial Expression: None, normal Lips and Perioral Area: None, normal Jaw: None, normal Tongue:  None, normal,Extremity Movements Upper (arms, wrists, hands, fingers): None, normal Lower (legs, knees, ankles, toes): None, normal, Trunk Movements Neck, shoulders, hips: None, normal, Overall Severity Severity of abnormal movements (highest score from questions above): None, normal Incapacitation due to abnormal movements: None, normal Patient's awareness of abnormal movements (rate only patient's report): No Awareness, Dental Status Current problems with teeth and/or dentures?: No Does patient usually wear dentures?: No  CIWA:  CIWA-Ar Total: 1 COWS:     See Psychiatric  Specialty Exam and Suicide Risk Assessment completed by Attending Physician prior to discharge.  Discharge destination:  Home  Is patient on multiple antipsychotic therapies at discharge:  No   Has Patient had three or more failed trials of antipsychotic monotherapy by history:  No  Recommended Plan for Multiple Antipsychotic Therapies: NA    Medication List    TAKE these medications      Indication   acamprosate 333 MG tablet  Commonly known as:  CAMPRAL  Take 2 tablets (666 mg total) by mouth 3 (three) times daily with meals. For alcohol cravings   Indication:  Excessive Use of Alcohol     escitalopram 20 MG tablet  Commonly known as:  LEXAPRO  Take 1 tablet (20 mg total) by mouth daily. For depression   Indication:  Depression     gabapentin 400 MG capsule  Commonly known as:  NEURONTIN  Take 1 capsule (400 mg) three times daily & 1 capsule (400 mg) at bedtime: For agitation   Indication:  Agitation     mirtazapine 15 MG tablet  Commonly known as:  REMERON  Take 1 tablet (15 mg total) by mouth at bedtime. For depression/insomnia   Indication:  Trouble Sleeping, Major Depressive Disorder       Follow-up Information    Follow up with [facility].   Specialty:  Behavioral Health   Why:  Walk-in clinic Monday-Friday between 8am to 3pm for assessment for therapy and medication management services.    Contact information:   [address] [place] [address] [phone]       Follow up with [facility] On 09/11/2015.   Why:  You will be picked up by [facility] transport at 11:00AM on this date. Make sure that you have all of your medications prior to leaving the hospital.    Contact information:   [address] [place], [place] [address] Phone: [phone] Fax: [phone]     Follow-up recommendations: Activity:  As tolerated Diet: As recommended by your primary care doctor. Keep all scheduled follow-up appointments as recommended.   Comments: Take all your medications  as prescribed by your mental healthcare provider. Report any adverse effects and or reactions from your medicines to your outpatient provider promptly. Patient is instructed and cautioned to not engage in alcohol and or illegal drug use while on prescription medicines. In the event of worsening symptoms, patient is instructed to call the crisis hotline, 911 and or go to the nearest ED for appropriate evaluation and treatment of symptoms. Follow-up with your primary care provider for your other medical issues, concerns and or health care needs.   Total Discharge Time: Greater than 30 minutes  Signed: Sanjuana Kava, PMHNP, FNP-BC 09/11/2015, 10:14 AM  I personally assessed the patient and formulated the plan Madie Reno A. Dub Mikes, M.D.

## 2015-09-11 NOTE — Progress Notes (Signed)
D:  Patient alert and oriented  X 4. Patient denies pain/SI/SI/AVH. Patient states anxiety is gone. Patient states he is having a better day, he spoke with his son for a few minutes before his wife hung up. Patient states he has to work on getting his self straight so he can see his son. This Clinical research associate gave words of encouragement to patient. Patient was very animated and pleasant during shift.  A: Staff to monitor Q 15 mins for safety. Encouragement and support offered. Scheduled medications administered per orders. R: Patient remains safe on the unit. Patient attended group tonight. Patient visible on the unit and interacting with peers. Patient taking administered medications.

## 2015-09-11 NOTE — Progress Notes (Signed)
Discharge note: Pt received both written and verbal discharge instructions. Pt verbalized understanding of discharge instructions. Pt agreed to f/u appt and med regimen. Pt denies suicidal thoughts. Pt denies withdrawal symptoms. Pt received sample meds, prescriptions and belongings. Pt safely left [facility] with [facility] transportation.

## 2015-09-11 NOTE — [facility] Suicide Risk Assessment (Signed)
[facility] Discharge Suicide Risk Assessment   Demographic Factors:  Male and Caucasian  Total Time spent with patient: 30 minutes  Musculoskeletal: Strength & Muscle Tone: within normal limits Gait & Station: normal Patient leans: normal  Psychiatric Specialty Exam: Physical Exam  Review of Systems  Constitutional: Negative.   HENT: Negative.   Eyes: Negative.   Respiratory: Negative.   Cardiovascular: Negative.   Gastrointestinal: Negative.   Genitourinary: Negative.   Musculoskeletal: Negative.   Skin: Negative.   Neurological: Negative.   Endo/Heme/Allergies: Negative.   Psychiatric/Behavioral: Positive for substance abuse.    Blood pressure 109/97, pulse 93, temperature 97.6 F (36.4 C), temperature source Oral, resp. rate 18, SpO2 99 %.There is no weight on file to calculate BMI.  General Appearance: Fairly Groomed  Patent attorney::  Fair  Speech:  Clear and Coherent409  Volume:  Normal  Mood:  Euthymic  Affect:  Appropriate  Thought Process:  Coherent and Goal Directed  Orientation:  Full (Time, Place, and Person)  Thought Content:  plans as he moves on, relapse prevention plan  Suicidal Thoughts:  No  Homicidal Thoughts:  No  Memory:  Immediate;   Fair Recent;   Fair Remote;   Fair  Judgement:  Fair  Insight:  Present  Psychomotor Activity:  Normal  Concentration:  Fair  Recall:  [employer] of Knowledge:Fair  Language: Fair  Akathisia:  No  Handed:  Right  AIMS (if indicated):     Assets:  Desire for Improvement Vocational/Educational  Sleep:  Number of Hours: 5.5  Cognition: WNL  ADL's:  Intact   Have you used any form of tobacco in the last 30 days? (Cigarettes, Smokeless Tobacco, Cigars, and/or Pipes): Yes  Has this patient used any form of tobacco in the last 30 days? (Cigarettes, Smokeless Tobacco, Cigars, and/or Pipes) Yes, A prescription for an FDA-approved tobacco cessation medication was offered at discharge and the patient refused  Mental Status  Per Nursing Assessment::   On Admission:  NA  Current Mental Status by Physician: In full contact with reality. There are no active S/S of withdrawal. There are no active SI plans or intent. He is planning to go to [facility] and pursue long term abstinence   Loss Factors: NA  Historical Factors: NA  Risk Reduction Factors:   Sense of responsibility to family, Employed and Positive social support  Continued Clinical Symptoms:  Alcohol/Substance Abuse/Dependencies  Cognitive Features That Contribute To Risk:  None    Suicide Risk:  Minimal: No identifiable suicidal ideation.  Patients presenting with no risk factors but with morbid ruminations; may be classified as minimal risk based on the severity of the depressive symptoms  Principal Problem: Severe recurrent major depression without psychotic features Discharge Diagnoses:  Patient Active Problem List   Diagnosis Date Noted  . Cocaine use disorder, moderate, dependence [F14.20] 09/07/2015  . Severe recurrent major depression without psychotic features [F33.2] 09/01/2015  . Alcohol use disorder, severe, dependence [F10.20] 09/01/2015  . Severe alcohol use disorder [F10.99] 09/01/2015    Follow-up Information    Follow up with [facility].   Specialty:  Behavioral Health   Why:  Walk-in clinic Monday-Friday between 8am to 3pm for assessment for therapy and medication management services.    Contact information:   [address] [place] [address] [phone]       Follow up with [facility] On 09/11/2015.   Why:  You will be picked up by [facility] transport at 11:00AM on this date. Make sure  that you have all of your medications prior to leaving the hospital.    Contact information:   [address] [place], [place] [address] Phone: [phone] Fax: [phone]      Plan Of Care/Follow-up recommendations:  Activity:  as tolerated Diet:  regular Follow up [facility] Is patient on multiple antipsychotic therapies at discharge:  No    Has Patient had three or more failed trials of antipsychotic monotherapy by history:  No  Recommended Plan for Multiple Antipsychotic Therapies: NA    Moroni Nester A 09/11/2015, 9:56 AM
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
## 2017-01-01 ENCOUNTER — Emergency Department (HOSPITAL_COMMUNITY)
Admission: EM | Admit: 2017-01-01 | Discharge: 2017-01-01 | Disposition: A | Discharge status: Home / Self Care | Payer: No Typology Code available for payment source | Attending: Emergency Medicine | Admitting: Emergency Medicine

## 2017-01-01 ENCOUNTER — Encounter (HOSPITAL_COMMUNITY): Payer: Self-pay

## 2017-01-01 DIAGNOSIS — F17210 Nicotine dependence, cigarettes, uncomplicated: Secondary | ICD-10-CM | POA: Insufficient documentation

## 2017-01-01 DIAGNOSIS — S39012A Strain of muscle, fascia and tendon of lower back, initial encounter: Secondary | ICD-10-CM | POA: Diagnosis not present

## 2017-01-01 DIAGNOSIS — M62830 Muscle spasm of back: Secondary | ICD-10-CM

## 2017-01-01 DIAGNOSIS — S3992XA Unspecified injury of lower back, initial encounter: Secondary | ICD-10-CM | POA: Diagnosis present

## 2017-01-01 DIAGNOSIS — Y92410 Unspecified street and highway as the place of occurrence of the external cause: Secondary | ICD-10-CM | POA: Insufficient documentation

## 2017-01-01 DIAGNOSIS — Y999 Unspecified external cause status: Secondary | ICD-10-CM | POA: Diagnosis not present

## 2017-01-01 DIAGNOSIS — Y939 Activity, unspecified: Secondary | ICD-10-CM | POA: Diagnosis not present

## 2017-01-01 MED ORDER — LIDOCAINE 5 % EX PTCH: 1.0000 | MEDICATED_PATCH | CUTANEOUS | 0 refills | Status: DC

## 2017-01-01 MED ORDER — DIAZEPAM 5 MG PO TABS: 5.0000 mg | ORAL_TABLET | Freq: Three times a day (TID) | ORAL | 0 refills | Status: DC | PRN

## 2017-01-01 MED ORDER — IBUPROFEN 800 MG PO TABS: 800.0000 mg | ORAL_TABLET | Freq: Three times a day (TID) | ORAL | 0 refills | Status: DC | PRN
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
## 2017-01-01 NOTE — Discharge Instructions (Signed)
Read the information below.  Use the prescribed medication as directed.  Please discuss all new medications with your pharmacist.  You may return to the Emergency Department at any time for worsening condition or any new symptoms that concern you.    If you develop fevers, loss of control of bowel or bladder, weakness or numbness in your legs, or are unable to walk, return to the ER for a recheck.  °
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
## 2017-08-01 ENCOUNTER — Inpatient Hospital Stay (HOSPITAL_COMMUNITY)
Admission: EM | Admit: 2017-08-01 | Discharge: 2017-08-04 | DRG: 918 | Payer: Medicaid Other | Attending: Internal Medicine | Admitting: Internal Medicine

## 2017-08-01 ENCOUNTER — Encounter (HOSPITAL_COMMUNITY): Payer: Self-pay | Admitting: Emergency Medicine

## 2017-08-01 DIAGNOSIS — F17210 Nicotine dependence, cigarettes, uncomplicated: Secondary | ICD-10-CM | POA: Diagnosis present

## 2017-08-01 DIAGNOSIS — F332 Major depressive disorder, recurrent severe without psychotic features: Secondary | ICD-10-CM | POA: Diagnosis not present

## 2017-08-01 DIAGNOSIS — F1420 Cocaine dependence, uncomplicated: Secondary | ICD-10-CM | POA: Diagnosis not present

## 2017-08-01 DIAGNOSIS — T1491XA Suicide attempt, initial encounter: Secondary | ICD-10-CM | POA: Diagnosis present

## 2017-08-01 DIAGNOSIS — T391X2D Poisoning by 4-Aminophenol derivatives, intentional self-harm, subsequent encounter: Secondary | ICD-10-CM | POA: Diagnosis not present

## 2017-08-01 DIAGNOSIS — F1994 Other psychoactive substance use, unspecified with psychoactive substance-induced mood disorder: Secondary | ICD-10-CM | POA: Diagnosis not present

## 2017-08-01 DIAGNOSIS — Z888 Allergy status to other drugs, medicaments and biological substances status: Secondary | ICD-10-CM

## 2017-08-01 DIAGNOSIS — T391X2A Poisoning by 4-Aminophenol derivatives, intentional self-harm, initial encounter: Principal | ICD-10-CM | POA: Diagnosis present

## 2017-08-01 DIAGNOSIS — Y903 Blood alcohol level of 60-79 mg/100 ml: Secondary | ICD-10-CM | POA: Diagnosis present

## 2017-08-01 DIAGNOSIS — F1010 Alcohol abuse, uncomplicated: Secondary | ICD-10-CM | POA: Diagnosis not present

## 2017-08-01 DIAGNOSIS — T391X4A Poisoning by 4-Aminophenol derivatives, undetermined, initial encounter: Secondary | ICD-10-CM

## 2017-08-01 DIAGNOSIS — R001 Bradycardia, unspecified: Secondary | ICD-10-CM | POA: Diagnosis present

## 2017-08-01 DIAGNOSIS — F1920 Other psychoactive substance dependence, uncomplicated: Secondary | ICD-10-CM

## 2017-08-01 DIAGNOSIS — W268XXA Contact with other sharp object(s), not elsewhere classified, initial encounter: Secondary | ICD-10-CM

## 2017-08-01 DIAGNOSIS — Z915 Personal history of self-harm: Secondary | ICD-10-CM

## 2017-08-01 DIAGNOSIS — F329 Major depressive disorder, single episode, unspecified: Secondary | ICD-10-CM | POA: Diagnosis present

## 2017-08-01 DIAGNOSIS — T391X1A Poisoning by 4-Aminophenol derivatives, accidental (unintentional), initial encounter: Secondary | ICD-10-CM | POA: Diagnosis not present

## 2017-08-01 DIAGNOSIS — F1020 Alcohol dependence, uncomplicated: Secondary | ICD-10-CM | POA: Diagnosis not present

## 2017-08-01 DIAGNOSIS — R739 Hyperglycemia, unspecified: Secondary | ICD-10-CM | POA: Diagnosis present

## 2017-08-01 LAB — URINALYSIS, ROUTINE W REFLEX MICROSCOPIC
Bilirubin Urine: NEGATIVE
Glucose, UA: NEGATIVE mg/dL
Hgb urine dipstick: NEGATIVE
KETONES UR: NEGATIVE mg/dL
Leukocytes, UA: NEGATIVE
NITRITE: NEGATIVE
PH: 6 (ref 5.0–8.0)
PROTEIN: NEGATIVE mg/dL
SPECIFIC GRAVITY, URINE: 1.009 (ref 1.005–1.030)

## 2017-08-01 LAB — COMPREHENSIVE METABOLIC PANEL
ALBUMIN: 4.2 g/dL (ref 3.5–5.0)
ALT: 14 U/L — AB (ref 17–63)
ALT: 12 U/L — AB (ref 17–63)
AST: 31 U/L (ref 15–41)
AST: 19 U/L (ref 15–41)
Albumin: 3.6 g/dL (ref 3.5–5.0)
Alkaline Phosphatase: 42 U/L (ref 38–126)
Alkaline Phosphatase: 36 U/L — ABNORMAL LOW (ref 38–126)
Anion gap: 17 — ABNORMAL HIGH (ref 5–15)
Anion gap: 8 (ref 5–15)
BILIRUBIN TOTAL: 0.9 mg/dL (ref 0.3–1.2)
BUN: 7 mg/dL (ref 6–20)
BUN: 6 mg/dL (ref 6–20)
CALCIUM: 8.1 mg/dL — AB (ref 8.9–10.3)
CO2: 17 mmol/L — AB (ref 22–32)
CO2: 21 mmol/L — ABNORMAL LOW (ref 22–32)
CREATININE: 0.93 mg/dL (ref 0.61–1.24)
CREATININE: 0.72 mg/dL (ref 0.61–1.24)
Calcium: 8.8 mg/dL — ABNORMAL LOW (ref 8.9–10.3)
Chloride: 106 mmol/L (ref 101–111)
Chloride: 111 mmol/L (ref 101–111)
GFR calc non Af Amer: >60 mL/min (ref >60)
GLUCOSE: 134 mg/dL — AB (ref 65–99)
Glucose, Bld: 118 mg/dL — ABNORMAL HIGH (ref 65–99)
Potassium: 3.6 mmol/L (ref 3.5–5.1)
Potassium: 3.5 mmol/L (ref 3.5–5.1)
SODIUM: 140 mmol/L (ref 135–145)
Sodium: 140 mmol/L (ref 135–145)
TOTAL PROTEIN: 6.3 g/dL — AB (ref 6.5–8.1)
Total Bilirubin: 1.2 mg/dL (ref 0.3–1.2)
Total Protein: 7.2 g/dL (ref 6.5–8.1)

## 2017-08-01 LAB — CBC
HCT: 39 % (ref 39.0–52.0)
HEMOGLOBIN: 13.7 g/dL (ref 13.0–17.0)
MCH: 32.2 pg (ref 26.0–34.0)
MCHC: 35.1 g/dL (ref 30.0–36.0)
MCV: 91.5 fL (ref 78.0–100.0)
Platelets: 184 10*3/uL (ref 150–400)
RBC: 4.26 MIL/uL (ref 4.22–5.81)
RDW: 12.6 % (ref 11.5–15.5)
WBC: 5.6 10*3/uL (ref 4.0–10.5)

## 2017-08-01 LAB — RAPID URINE DRUG SCREEN, HOSP PERFORMED
AMPHETAMINES: NOT DETECTED
Barbiturates: NOT DETECTED
Benzodiazepines: NOT DETECTED
COCAINE: POSITIVE — AB
OPIATES: NOT DETECTED
TETRAHYDROCANNABINOL: POSITIVE — AB

## 2017-08-01 LAB — SALICYLATE LEVEL

## 2017-08-01 LAB — ACETAMINOPHEN LEVEL
Acetaminophen (Tylenol), Serum: 236 ug/mL (ref 10–30)
Acetaminophen (Tylenol), Serum: 202 ug/mL (ref 10–30)
Acetaminophen (Tylenol), Serum: 98 ug/mL — ABNORMAL HIGH (ref 10–30)

## 2017-08-01 LAB — ETHANOL: Alcohol, Ethyl (B): 63 mg/dL — ABNORMAL HIGH (ref <5)

## 2017-08-01 LAB — PROTIME-INR
INR: 1.03
INR: 1.24
PROTHROMBIN TIME: 13.5 s (ref 11.4–15.2)
PROTHROMBIN TIME: 15.7 s — AB (ref 11.4–15.2)

## 2017-08-01 LAB — CBG MONITORING, ED: Glucose-Capillary: 115 mg/dL — ABNORMAL HIGH (ref 65–99)

## 2017-08-01 LAB — MRSA PCR SCREENING: MRSA by PCR: POSITIVE — AB

## 2017-08-01 LAB — CK: CK TOTAL: 250 U/L (ref 49–397)

## 2017-08-01 LAB — TROPONIN I

## 2017-08-01 MED ORDER — GABAPENTIN 400 MG PO CAPS
400.0000 mg | ORAL_CAPSULE | Freq: Three times a day (TID) | ORAL | Status: DC
  Administered 2017-08-01 – 2017-08-03 (×7): 400 mg via ORAL
  Filled 2017-08-01 (×7): qty 1

## 2017-08-01 MED ORDER — ONDANSETRON HCL 4 MG/2ML IJ SOLN
4.0000 mg | Freq: Four times a day (QID) | INTRAMUSCULAR | Status: DC | PRN
  Administered 2017-08-01: 4 mg via INTRAVENOUS
  Filled 2017-08-01: qty 2

## 2017-08-01 MED ORDER — ONDANSETRON HCL 4 MG/2ML IJ SOLN
INTRAMUSCULAR | Status: AC
  Administered 2017-08-01: 4 mg
  Filled 2017-08-01: qty 2

## 2017-08-01 MED ORDER — SODIUM CHLORIDE 0.9 % IV BOLUS (SEPSIS)
1000.0000 mL | Freq: Once | INTRAVENOUS | Status: AC
  Administered 2017-08-01: 1000 mL via INTRAVENOUS

## 2017-08-01 MED ORDER — ESCITALOPRAM OXALATE 20 MG PO TABS
20.0000 mg | ORAL_TABLET | Freq: Every day | ORAL | Status: DC
  Administered 2017-08-01 – 2017-08-03 (×3): 20 mg via ORAL
  Filled 2017-08-01 (×3): qty 1

## 2017-08-01 MED ORDER — SODIUM CHLORIDE 0.9 % IV SOLN
INTRAVENOUS | Status: DC
  Administered 2017-08-01 – 2017-08-02 (×3): via INTRAVENOUS

## 2017-08-01 MED ORDER — DEXTROSE 5 % IV SOLN
15.0000 mg/kg/h | INTRAVENOUS | Status: DC
  Administered 2017-08-01: 15 mg/kg/h via INTRAVENOUS
  Filled 2017-08-01: qty 200

## 2017-08-01 MED ORDER — SODIUM CHLORIDE 0.9 % IV SOLN
INTRAVENOUS | Status: DC
  Administered 2017-08-01: 125 mL/h via INTRAVENOUS

## 2017-08-01 MED ORDER — ACETYLCYSTEINE LOAD VIA INFUSION
150.0000 mg/kg | Freq: Once | INTRAVENOUS | Status: AC
  Administered 2017-08-01: 10200 mg via INTRAVENOUS
  Filled 2017-08-01: qty 255

## 2017-08-01 NOTE — ED Notes (Addendum)
Daniel from poison control was called. Poison control wants another 4 hour tylenol if higher than 150 treat with mucomyst for 24 hours. If patient QRS is over 120 then the patient should get 30 ml bolus. EKG needs to be done in 3-4 hours. If QTC is greater than 500, then potassium and magnesium to optimize on the high end of normal. Treat seizures with benzo's. Agitation is treated with fluids and benzo's.

## 2017-08-01 NOTE — ED Provider Notes (Signed)
TIME SEEN: 4:00 AM  CHIEF COMPLAINT: Suicide attempt  HPI: Patient is a 36 year old male with known history of previous suicide attempts who presents to the emergency department via EMS. History is obtained from EMS. They report that patient took multiple scout tablets around 1 AM, drank approximately three 40 ounce beers a night and smoked crack cocaine. His brother called 911 because he was stating he wanted to kill himself. Was trying to cut himself with a razor. On EMS arrival, patient very agitated with a heart rate in the 150s. He was given 5 mg of IM Haldol and 5 mg of IM Versed before he was calm enough for transport. Initial heart rate was in the 150s but is now improved. Patient is now very drowsy but is arousable to voice and will open his eyes and move all extremities but does not answer questions. Blood glucose was 128 with EMS.  ROS: Level V caveat for clinical condition  PAST MEDICAL HISTORY/PAST SURGICAL HISTORY:  Past Medical History:  Diagnosis Date  . Pneumothorax 2014    MEDICATIONS:  Prior to Admission medications   Medication Sig Start Date End Date Taking? Authorizing Provider  acamprosate (CAMPRAL) 333 MG tablet Take 2 tablets (666 mg total) by mouth 3 (three) times daily with meals. For alcohol cravings 09/11/15   Armandina StammerNwoko, Agnes I, NP  diazepam (VALIUM) 5 MG tablet Take 1 tablet (5 mg total) by mouth every 8 (eight) hours as needed for muscle spasms. 01/01/17   Trixie DredgeWest, Emily, PA-C  escitalopram (LEXAPRO) 20 MG tablet Take 1 tablet (20 mg total) by mouth daily. For depression 09/11/15   Armandina StammerNwoko, Agnes I, NP  gabapentin (NEURONTIN) 400 MG capsule Take 1 capsule (400 mg) three times daily & 1 capsule (400 mg) at bedtime: For agitation 09/11/15   Armandina StammerNwoko, Agnes I, NP  ibuprofen (ADVIL,MOTRIN) 800 MG tablet Take 1 tablet (800 mg total) by mouth every 8 (eight) hours as needed for mild pain or moderate pain. 01/01/17   Trixie DredgeWest, Emily, PA-C  lidocaine (LIDODERM) 5 % Place 1 patch onto the skin  daily. Remove & Discard patch within 12 hours or as directed by MD 01/01/17   Trixie DredgeWest, Emily, PA-C  mirtazapine (REMERON) 15 MG tablet Take 1 tablet (15 mg total) by mouth at bedtime. For depression/insomnia 09/11/15   Sanjuana KavaNwoko, Agnes I, NP    ALLERGIES:  Allergies  Allergen Reactions  . Haloperidol And Related Other (See Comments)    Joints lock up    SOCIAL HISTORY:  Social History  Substance Use Topics  . Smoking status: Current Every Day Smoker    Packs/day: 0.75    Types: Cigarettes  . Smokeless tobacco: Never Used  . Alcohol use No    FAMILY HISTORY: No family history on file.  EXAM: BP (!) 92/57 (BP Location: Right Arm)   Pulse 91   Temp 97.9 F (36.6 C) (Oral)   Resp (!) 21   Ht 6\' 1"  (1.854 m)   Wt 68 kg (150 lb)   SpO2 98%   BMI 19.79 kg/m  CONSTITUTIONAL: Patient will open his eyes and move all 4 extremities spontaneously. He does not answer questions or follow commands. Chronically ill-appearing. Drowsy but arousable. HEAD: Normocephalic, atraumatic EYES: Conjunctivae clear, pupils are 2-3 mm and sluggish but equal bilaterally ENT: normal nose; moist mucous membranes, no dental injury NECK: Supple, no meningismus, no nuchal rigidity, no LAD; trachea is midline, no cervical step-off or deformity CARD: RRR; S1 and S2 appreciated; no murmurs, no  clicks, no rubs, no gallops CHEST:  Chest wall is nontender to palpation.  No crepitus, ecchymosis, erythema, warmth, rash or other lesions present.   RESP: Normal chest excursion without splinting or tachypnea; breath sounds clear and equal bilaterally; no wheezes, no rhonchi, no rales, no hypoxia or respiratory distress, speaking full sentences ABD/GI: Normal bowel sounds; non-distended; soft, non-tender, no rebound, no guarding, no peritoneal signs, no hepatosplenomegaly BACK:  The back appears normal and is non-tender to palpation, there is no CVA tenderness; no midline step-off or deformity EXT: Normal ROM in all joints;  non-tender to palpation; no edema; normal capillary refill; no cyanosis, no calf tenderness or swelling    SKIN: Normal color for age and race; warm; no rash NEURO: Moves all extremities equally and opens his eyes spontaneously but does not answer questions or follow commands PSYCH: Reported suicide attempt. Patient unable to answer questions at this time.  MEDICAL DECISION MAKING: Patient here after reported suicide attempt. EKG shows nonspecific intraventricular conduction delay. QTC is normal. Reportedly overdosed on Tylenol. Will obtain labs, urine. This time he is sedate after Versed and Haldol but protecting his airway. He will need to be monitored closely and once medically cleared will need a TTS evaluation.  I will place him under IVC.  ED PROGRESS: 5:35 AM  [facility]  Patient's Tylenol level is 236. Given we are not clear of the time of ingestion and this level is so high, he agrees with this starting IV N-acetylcysteine. Discussed with pharmacist and have placed orders. Will continue IV hydration. He is hemodynamically stable. I have placed him under IVC. We'll discuss with hospitalist for admission.  6:01 AM Discussed patient's case with hospitalist, Dr. Selena BattenKim.  I have recommended admission and patient (and family if present) agree with this plan. Admitting physician will place admission orders.   I reviewed all nursing notes, vitals, pertinent previous records, EKGs, lab and urine results, imaging (as available).     EKG Interpretation  Date/Time:  Saturday August 01 2017 03:54:53 EDT Ventricular Rate:  98 PR Interval:    QRS Duration: 115 QT Interval:  381 QTC Calculation: 487 R Axis:   83 Text Interpretation:  Sinus rhythm Nonspecific intraventricular conduction delay No old tracing to compare Confirmed by Elverna Caffee, Baxter HireKristen [phone] on 08/01/2017 3:59:56 AM        CRITICAL CARE Performed by: Raelyn NumberWARD, Kayson Tasker N   Total critical care time: 45  minutes  Critical care time was exclusive of separately billable procedures and treating other patients.  Critical care was necessary to treat or prevent imminent or life-threatening deterioration.  Critical care was time spent personally by me on the following activities: development of treatment plan with patient and/or surrogate as well as nursing, discussions with consultants, evaluation of patient's response to treatment, examination of patient, obtaining history from patient or surrogate, ordering and performing treatments and interventions, ordering and review of laboratory studies, ordering and review of radiographic studies, pulse oximetry and re-evaluation of patient's condition.    Veeda Virgo, Layla MawKristen N, DO 08/01/17 [phone]

## 2017-08-01 NOTE — Progress Notes (Signed)
CRITICAL VALUE ALERT  Critical Value:  Acetaminophen 202  Date & Time Notied:  08/01/2017 0900  Provider Notified: Dr. Arbutus Leasat  Orders Received/Actions taken:

## 2017-08-01 NOTE — Progress Notes (Signed)
Spoke with [place] poison control Jeana.  She recommended to complete another EKG and watch for QRS and QTc and to give 1-2 meQ/kG of sodium bicarb push if QRS is 120 or above.  Erick Blinksuchman, Yukio Bisping D, RN

## 2017-08-01 NOTE — Consult Note (Signed)
[place] [facility]iatry Consult   Reason for Consult:  SI-overdosed on Tylenol Referring Physician:  Dr.Tat Patient Identification: Rodney Fernandez MRN:  [id number] Principal Diagnosis: Substance induced mood disorder [facility]) Diagnosis:   Patient Active Problem List   Diagnosis Date Noted  . Severe recurrent major depression without psychotic features ([place]) [F33.2] 09/01/2015    Priority: High  . Alcohol use disorder, severe, dependence ([place]) [F10.20] 09/01/2015    Priority: High  . Tylenol overdose [T39.1X1A] 08/01/2017  . Alcohol abuse [F10.10] 08/01/2017  . Substance induced mood disorder ([place]) [F19.94] 08/01/2017  . Polysubstance dependence ([place]) [F19.20] 08/01/2017  . Suicide attempt ([place]) [T14.91XA]   . Cocaine use disorder, moderate, dependence ([place]) [F14.20] 09/07/2015  . Severe alcohol use disorder ([place]) [F10.20] 09/01/2015    Total Time spent with patient: 45 minutes  Subjective:   Rodney Fernandez is a 36 y.o. male patient admitted with suicide attempt by overdosing on Tylenol.  HPI:  Patient reports history of Polysubstance abuse, and Major depression for which he is not currently take medications. Patient reports that he was disappointed in himself after he relapsed on drugs. He has been using drugs since age 43 and has had multiple inpatient and outpatient drug rehabs. However, patient reports that he was attending NA meetings regularly and was sober for 18 months but relapsed on Cocaine, Alcohol and Marijuana 2 days ago. He states that even though he took 50 tablets of Tylenol, he did not intend to die, he was just disappointed in himself. Patient states that he lives with his brother who has been helping him a lot. Today, he denies psychosis, delusions, SI/HI and states that: '' I have a lot to live for, I will never take my life.''  Past Psychiatric History: as above  Risk to Self: Is patient at risk for suicide?: Denies Risk to Others:   Prior Inpatient Therapy:   Prior  Outpatient Therapy:    Past Medical History:  Past Medical History:  Diagnosis Date  . Pneumothorax 2014    Past Surgical History:  Procedure Laterality Date  . CLAVICLE SURGERY    . ORTHOPEDIC SURGERY  2014   Metal placement in rght hand and left shoulder   Family History: History reviewed. No pertinent family history. Family Psychiatric  History:  Social History:  History  Alcohol Use No     History  Drug Use No    Comment: Denies (01/01/17)    Social History   Social History  . Marital status: Single    Spouse name: N/A  . Number of children: N/A  . Years of education: N/A   Social History Main Topics  . Smoking status: Current Every Day Smoker    Packs/day: 0.75    Types: Cigarettes  . Smokeless tobacco: Never Used  . Alcohol use No  . Drug use: No     Comment: Denies (01/01/17)  . Sexual activity: Yes   Other Topics Concern  . None   Social History Narrative  . None   Additional Social History:    Allergies:   Allergies  Allergen Reactions  . Haloperidol And Related Other (See Comments)    Reaction:  Causes pts joints to lock    Labs:  Results for orders placed or performed during the hospital encounter of 08/01/17 (from the past 48 hour(s))  Comprehensive metabolic panel     Status: Abnormal   Collection Time: 08/01/17  4:06 AM  Result Value Ref Range   Sodium 140 135 - 145 mmol/L  Potassium 3.6 3.5 - 5.1 mmol/L   Chloride 106 101 - 111 mmol/L   CO2 17 (L) 22 - 32 mmol/L   Glucose, Bld 134 (H) 65 - 99 mg/dL   BUN 7 6 - 20 mg/dL   Creatinine, Ser 0.93 0.61 - 1.24 mg/dL   Calcium 8.8 (L) 8.9 - 10.3 mg/dL   Total Protein 7.2 6.5 - 8.1 g/dL   Albumin 4.2 3.5 - 5.0 g/dL   AST 31 15 - 41 U/L   ALT 14 (L) 17 - 63 U/L   Alkaline Phosphatase 42 38 - 126 U/L   Total Bilirubin 1.2 0.3 - 1.2 mg/dL   GFR calc non Af Amer >60 >60 mL/min   GFR calc Af Amer >60 >60 mL/min    Comment: (NOTE) The eGFR has been calculated using the CKD EPI  equation. This calculation has not been validated in all clinical situations. eGFR's persistently <60 mL/min signify possible Chronic Kidney Disease.    Anion gap 17 (H) 5 - 15  cbc     Status: None   Collection Time: 08/01/17  4:06 AM  Result Value Ref Range   WBC 5.6 4.0 - 10.5 K/uL   RBC 4.26 4.22 - 5.81 MIL/uL   Hemoglobin 13.7 13.0 - 17.0 g/dL   HCT 39.0 39.0 - 52.0 %   MCV 91.5 78.0 - 100.0 fL   MCH 32.2 26.0 - 34.0 pg   MCHC 35.1 30.0 - 36.0 g/dL   RDW 12.6 11.5 - 15.5 %   Platelets 184 150 - 400 K/uL  Ethanol     Status: Abnormal   Collection Time: 08/01/17  4:08 AM  Result Value Ref Range   Alcohol, Ethyl (B) 63 (H) <5 mg/dL    Comment:        LOWEST DETECTABLE LIMIT FOR SERUM ALCOHOL IS 5 mg/dL FOR MEDICAL PURPOSES ONLY   Salicylate level     Status: None   Collection Time: 08/01/17  4:08 AM  Result Value Ref Range   Salicylate Lvl <4.5 2.8 - 30.0 mg/dL  Acetaminophen level     Status: Abnormal   Collection Time: 08/01/17  4:08 AM  Result Value Ref Range   Acetaminophen (Tylenol), Serum 236 (HH) 10 - 30 ug/mL    Comment:        THERAPEUTIC CONCENTRATIONS VARY SIGNIFICANTLY. A RANGE OF 10-30 ug/mL MAY BE AN EFFECTIVE CONCENTRATION FOR MANY PATIENTS. HOWEVER, SOME ARE BEST TREATED AT CONCENTRATIONS OUTSIDE THIS RANGE. ACETAMINOPHEN CONCENTRATIONS >150 ug/mL AT 4 HOURS AFTER INGESTION AND >50 ug/mL AT 12 HOURS AFTER INGESTION ARE OFTEN ASSOCIATED WITH TOXIC REACTIONS. CRITICAL RESULT CALLED TO, READ BACK BY AND VERIFIED WITH: J NASH AT 0519 ON 08.04.2018 BY NBROOKS   Urinalysis, Routine w reflex microscopic     Status: Abnormal   Collection Time: 08/01/17  4:08 AM  Result Value Ref Range   Color, Urine STRAW (A) YELLOW   APPearance CLEAR CLEAR   Specific Gravity, Urine 1.009 1.005 - 1.030   pH 6.0 5.0 - 8.0   Glucose, UA NEGATIVE NEGATIVE mg/dL   Hgb urine dipstick NEGATIVE NEGATIVE   Bilirubin Urine NEGATIVE NEGATIVE   Ketones, ur NEGATIVE  NEGATIVE mg/dL   Protein, ur NEGATIVE NEGATIVE mg/dL   Nitrite NEGATIVE NEGATIVE   Leukocytes, UA NEGATIVE NEGATIVE  CK     Status: None   Collection Time: 08/01/17  4:08 AM  Result Value Ref Range   Total CK 250 49 - 397 U/L  Rapid urine drug  screen (hospital performed)     Status: Abnormal   Collection Time: 08/01/17  4:10 AM  Result Value Ref Range   Opiates NONE DETECTED NONE DETECTED   Cocaine POSITIVE (A) NONE DETECTED   Benzodiazepines NONE DETECTED NONE DETECTED   Amphetamines NONE DETECTED NONE DETECTED   Tetrahydrocannabinol POSITIVE (A) NONE DETECTED   Barbiturates NONE DETECTED NONE DETECTED    Comment:        DRUG SCREEN FOR MEDICAL PURPOSES ONLY.  IF CONFIRMATION IS NEEDED FOR ANY PURPOSE, NOTIFY LAB WITHIN 5 DAYS.        LOWEST DETECTABLE LIMITS FOR URINE DRUG SCREEN Drug Class       Cutoff (ng/mL) Amphetamine      1000 Barbiturate      200 Benzodiazepine   401 Tricyclics       027 Opiates          300 Cocaine          300 THC              50   CBG monitoring, ED     Status: Abnormal   Collection Time: 08/01/17  4:25 AM  Result Value Ref Range   Glucose-Capillary 115 (H) 65 - 99 mg/dL  Protime-INR     Status: None   Collection Time: 08/01/17  6:08 AM  Result Value Ref Range   Prothrombin Time 13.5 11.4 - 15.2 seconds   INR 1.03   Acetaminophen level     Status: Abnormal   Collection Time: 08/01/17  7:39 AM  Result Value Ref Range   Acetaminophen (Tylenol), Serum 202 (HH) 10 - 30 ug/mL    Comment:        THERAPEUTIC CONCENTRATIONS VARY SIGNIFICANTLY. A RANGE OF 10-30 ug/mL MAY BE AN EFFECTIVE CONCENTRATION FOR MANY PATIENTS. HOWEVER, SOME ARE BEST TREATED AT CONCENTRATIONS OUTSIDE THIS RANGE. ACETAMINOPHEN CONCENTRATIONS >150 ug/mL AT 4 HOURS AFTER INGESTION AND >50 ug/mL AT 12 HOURS AFTER INGESTION ARE OFTEN ASSOCIATED WITH TOXIC REACTIONS. CRITICAL RESULT CALLED TO, READ BACK BY AND VERIFIED WITH: TUCHMAN,D. RN AT [phone] 08/01/17 MULLINS,T    MRSA PCR Screening     Status: Abnormal   Collection Time: 08/01/17  8:07 AM  Result Value Ref Range   MRSA by PCR POSITIVE (A) NEGATIVE    Comment:        The GeneXpert MRSA Assay (FDA approved for NASAL specimens only), is one component of a comprehensive MRSA colonization surveillance program. It is not intended to diagnose MRSA infection nor to guide or monitor treatment for MRSA infections. RESULT CALLED TO, READ BACK BY AND VERIFIED WITH: CUFFMAN D RN AT 6440 ON 08/01/17 BY INGLEE   Comprehensive metabolic panel     Status: Abnormal   Collection Time: 08/01/17 11:49 AM  Result Value Ref Range   Sodium 140 135 - 145 mmol/L   Potassium 3.5 3.5 - 5.1 mmol/L   Chloride 111 101 - 111 mmol/L   CO2 21 (L) 22 - 32 mmol/L   Glucose, Bld 118 (H) 65 - 99 mg/dL   BUN 6 6 - 20 mg/dL   Creatinine, Ser 0.72 0.61 - 1.24 mg/dL   Calcium 8.1 (L) 8.9 - 10.3 mg/dL   Total Protein 6.3 (L) 6.5 - 8.1 g/dL   Albumin 3.6 3.5 - 5.0 g/dL   AST 19 15 - 41 U/L   ALT 12 (L) 17 - 63 U/L   Alkaline Phosphatase 36 (L) 38 - 126 U/L   Total Bilirubin 0.9  0.3 - 1.2 mg/dL   GFR calc non Af Amer >60 >60 mL/min   GFR calc Af Amer >60 >60 mL/min    Comment: (NOTE) The eGFR has been calculated using the CKD EPI equation. This calculation has not been validated in all clinical situations. eGFR's persistently <60 mL/min signify possible Chronic Kidney Disease.    Anion gap 8 5 - 15  Protime-INR     Status: Abnormal   Collection Time: 08/01/17 11:49 AM  Result Value Ref Range   Prothrombin Time 15.7 (H) 11.4 - 15.2 seconds   INR 1.24   Acetaminophen level     Status: Abnormal   Collection Time: 08/01/17 11:49 AM  Result Value Ref Range   Acetaminophen (Tylenol), Serum 98 (H) 10 - 30 ug/mL    Comment:        THERAPEUTIC CONCENTRATIONS VARY SIGNIFICANTLY. A RANGE OF 10-30 ug/mL MAY BE AN EFFECTIVE CONCENTRATION FOR MANY PATIENTS. HOWEVER, SOME ARE BEST TREATED AT CONCENTRATIONS OUTSIDE  THIS RANGE. ACETAMINOPHEN CONCENTRATIONS >150 ug/mL AT 4 HOURS AFTER INGESTION AND >50 ug/mL AT 12 HOURS AFTER INGESTION ARE OFTEN ASSOCIATED WITH TOXIC REACTIONS.     Current Facility-Administered Medications  Medication Dose Route Frequency Provider Last Rate Last Dose  . 0.9 %  sodium chloride infusion   Intravenous Continuous Ward, Delice Bison, DO 125 mL/hr at 08/01/17 0436 125 mL/hr at 08/01/17 0436  . 0.9 %  sodium chloride infusion   Intravenous Continuous Jani Gravel, MD 100 mL/hr at 08/01/17 1300    . acetylcysteine (ACETADOTE) 40,000 mg in dextrose 5 % 1,000 mL (40 mg/mL) infusion  15 mg/kg/hr Intravenous Continuous Ward, Kristen N, DO 25.5 mL/hr at 08/01/17 1300 15 mg/kg/hr at 08/01/17 1300  . escitalopram (LEXAPRO) tablet 20 mg  20 mg Oral Daily Jani Gravel, MD      . gabapentin (NEURONTIN) capsule 400 mg  400 mg Oral TID Jani Gravel, MD      . ondansetron [facility]) injection 4 mg  4 mg Intravenous Q6H PRN Orson Eva, MD        Musculoskeletal: Strength & Muscle Tone: within normal limits Gait & Station: normal Patient leans: N/A  Psychiatric Specialty Exam: Physical Exam  Psychiatric: His behavior is normal. Judgment and thought content normal. His speech is delayed. Cognition and memory are normal. He exhibits a depressed mood.    Review of Systems  Constitutional: Positive for malaise/fatigue.  HENT: Negative.   Eyes: Negative.   Respiratory: Negative.   Cardiovascular: Negative.   Gastrointestinal: Negative.   Genitourinary: Negative.   Musculoskeletal: Negative.   Skin: Negative.   Neurological: Negative.   Endo/Heme/Allergies: Negative.   Psychiatric/Behavioral: Negative.     Blood pressure (!) 99/52, pulse 60, temperature 98.1 F (36.7 C), temperature source Oral, resp. rate 17, height 6' 1"  (1.854 m), weight 67.1 kg (147 lb 14.9 oz), SpO2 99 %.Body mass index is 19.52 kg/m.  General Appearance: Casual  Eye Contact:  Good  Speech:  Slow  Volume:   Decreased  Mood:  Dysphoric  Affect:  Constricted  Thought Process:  Coherent and Descriptions of Associations: Intact  Orientation:  Full (Time, Place, and Person)  Thought Content:  Logical  Suicidal Thoughts:  No  Homicidal Thoughts:  No  Memory:  Immediate;   Fair Recent;   Fair Remote;   Good  Judgement:  Intact  Insight:  Fair  Psychomotor Activity:  Decreased  Concentration:  Concentration: Good and Attention Span: Good  Recall:  Good  Fund of Knowledge:  Good  Language:  Good  Akathisia:  No  Handed:  Right  AIMS (if indicated):     Assets:  Communication Skills Desire for Improvement Social Support  ADL's:  Intact  Cognition:  WNL  Sleep:   fair     Treatment Plan Summary: Plan: Continue Lexapro and Gabapentin Patient is cleared by psychiatric service Needs referral to ADS upon discharge.  Disposition: No evidence of imminent risk to self or others at present.   Patient does not meet criteria for psychiatric inpatient admission. Supportive therapy provided about ongoing stressors. Unit Social worker to assist with referring patient to ADS.  Corena Pilgrim, MD 08/01/2017 1:35 PM

## 2017-08-01 NOTE — Progress Notes (Signed)
PROGRESS NOTE  Rodney Fernandez ZOX:[id number] DOB: 1981-08-23 DOA: 08/01/2017 PCP: Patient, No Pcp Per  Brief History:  36 year old male with a history of polysubstance abuse, depression with previous suicidal attempts presented via EMS after his brother called secondary to the patient's suicidal ideation. Apparently, the patient was tried to cut himself with a razor. Upon EMS arrival, the patient was agitated with a heart rate in the 150s. The patient was given Haldol 5 mg IM, and Versed 5 mg IM. He was somnolent at the time of evaluation in emergency department, but he was arousable to voice and opened his eyes and moved his extremities spontaneously. The patient reportedly took 50 tablets of acetaminophen and his suicide attempt. Poison control was contacted by the ED, and they recommended starting the patient on IV N-acetylcysteine as the patient's Tylenol level was 236 with uncertain time of ingestion. The patient was preceded by EDP, Dr. Elesa MassedWard. Initial ALT was 14 with INR 1.03,  The patient was admitted for further evaluation and treatment.  Notably, the patient states that he has not followed up with psychiatry for over 5 years. He states that he has stopped taking all his medications.  Assessment/Plan: Intentional acetaminophen overdose -Continue N-acetylcysteine protocol for the next 24 hours -Repeat LFTs and INR -Continue fluid hydration -Symptomatically treatment for nausea and vomiting  Alcohol abuse -Patient states that he drinks 15-18 beers daily -Last alcohol drink was 08/01/2017 1 AM  -alcohol withdrawal protocol -alcohol level 63 at time of admission  Depression/suicidal ideation  -Continue one-on-one sitter  -I have consulted psychiatry  Polysubstance abuse  -Including tobacco, cocaine and cannabis  -Patient states that he uses 2-3 times per week  -nicoderm patch   Hyperglycemia -check A1C  Disposition Plan:   [facility] vs home  in 1-2 days  Family Communication:    No Family at bedside--Total time spent 31 minutes.  Greater than 50% spent face to face counseling and coordinating care.  0710 to 0741   Consultants:  psychiatry  Code Status:  FULL  DVT Prophylaxis:  SCDs   Procedures: As Listed in Progress Note Above  Antibiotics: None    Subjective:  patient had an episode of nausea and vomiting this morning. He denies any fevers, chills, chest pain or shortness breath, headache, neck pain, abdominal pain, dysuria, hematuria.  Objective: Vitals:   08/01/17 0545 08/01/17 0600 08/01/17 0615 08/01/17 0716  BP: 100/68 91/62 106/71 113/66  Pulse: 71 71 61   Resp: 16 18 18    Temp:    98.1 F (36.7 C)  TempSrc:    Oral  SpO2: 99% 100% 100%   Weight:    67.1 kg (147 lb 14.9 oz)  Height:    6\' 1"  (1.854 m)    Intake/Output Summary (Last 24 hours) at 08/01/17 0745 Last data filed at 08/01/17 [id number]  Gross per 24 hour  Intake             2000 ml  Output                0 ml  Net             2000 ml   Weight change:  Exam:   General:  Pt is alert, follows commands appropriately, not in acute distress  HEENT: No icterus, No thrush, No neck mass, [place]/AT  Cardiovascular: RRR, S1/S2, no rubs, no gallops  Respiratory: CTA bilaterally, no wheezing, no crackles, no rhonchi  Abdomen:  Soft/+BS, non tender, non distended, no guarding  Extremities: No edema, No lymphangitis, No petechiae, No rashes, no synovitis   Data Reviewed: I have personally reviewed following labs and imaging studies Basic Metabolic Panel:  Recent Labs Lab 08/01/17 0406  NA 140  K 3.6  CL 106  CO2 17*  GLUCOSE 134*  BUN 7  CREATININE 0.93  CALCIUM 8.8*   Liver Function Tests:  Recent Labs Lab 08/01/17 0406  AST 31  ALT 14*  ALKPHOS 42  BILITOT 1.2  PROT 7.2  ALBUMIN 4.2   No results for input(s): LIPASE, AMYLASE in the last 168 hours. No results for input(s): AMMONIA in the last 168 hours. Coagulation Profile:  Recent Labs Lab 08/01/17 0608    INR 1.03   CBC:  Recent Labs Lab 08/01/17 0406  WBC 5.6  HGB 13.7  HCT 39.0  MCV 91.5  PLT 184   Cardiac Enzymes:  Recent Labs Lab 08/01/17 0408  CKTOTAL 250   BNP: Invalid input(s): POCBNP CBG:  Recent Labs Lab 08/01/17 0425  GLUCAP 115*   HbA1C: No results for input(s): HGBA1C in the last 72 hours. Urine analysis:    Component Value Date/Time   COLORURINE STRAW (A) 08/01/2017 0408   APPEARANCEUR CLEAR 08/01/2017 0408   LABSPEC 1.009 08/01/2017 0408   PHURINE 6.0 08/01/2017 0408   GLUCOSEU NEGATIVE 08/01/2017 0408   HGBUR NEGATIVE 08/01/2017 0408   BILIRUBINUR NEGATIVE 08/01/2017 0408   KETONESUR NEGATIVE 08/01/2017 0408   PROTEINUR NEGATIVE 08/01/2017 0408   NITRITE NEGATIVE 08/01/2017 0408   LEUKOCYTESUR NEGATIVE 08/01/2017 0408   Sepsis Labs: @LABRCNTIP (procalcitonin:4,lacticidven:4) )No results found for this or any previous visit (from the past 240 hour(s)).   Scheduled Meds: . acetylcysteine  150 mg/kg Intravenous Once  . escitalopram  20 mg Oral Daily  . gabapentin  400 mg Oral TID   Continuous Infusions: . sodium chloride 125 mL/hr (08/01/17 0436)  . sodium chloride 100 mL/hr at 08/01/17 0728  . acetylcysteine 15 mg/kg/hr (08/01/17 [id number])    Procedures/Studies: No results found.  Sumayyah Custodio, DO  [facility] Pager [phone]  If 7PM-7AM, please contact night-coverage [email or web address] Password [email or web address] 08/01/2017, 7:45 AM   LOS: 0 days

## 2017-08-01 NOTE — H&P (Signed)
TRH H&P   Patient Demographics:    Wynetta Finesony Delgadillo, is a 36 y.o. male  MRN: [id number]   DOB - [birth year]  Admit Date - 08/01/2017  Outpatient Primary MD for the patient is Patient, No Pcp Per  Referring MD/NP/PA: Dr. Elesa MassedWard  Outpatient Specialists:   Patient coming from:  home  Chief Complaint  Patient presents with  . Suicidal  . Drug Overdose      HPI:    Wynetta Finesony Grindstaff  is a 36 y.o. male, w coccaine abuse, etoh abuse, apparently took 50+ capsules of tylenol 500mg , due to " I m tired of being a screw up "  In ED, pt Hco3=17, Ast 31, Alt 14, INR pending,  Pt will be admitted for tylenol OD and ? Suicidal ideation.    Review of systems:    In addition to the HPI above,  No Fever-chills, No Headache, No changes with Vision or hearing, No problems swallowing food or Liquids, No Chest pain, Cough or Shortness of Breath, No Abdominal pain, No Nausea or Vommitting, Bowel movements are regular, No Blood in stool or Urine, No dysuria, No new skin rashes or bruises, No new joints pains-aches,  No new weakness, tingling, numbness in any extremity, No recent weight gain or loss, No polyuria, polydypsia or polyphagia, No significant Mental Stressors.  A full 10 point Review of Systems was done, except as stated above, all other Review of Systems were negative.   With Past History of the following :    Past Medical History:  Diagnosis Date  . Pneumothorax 2014      Past Surgical History:  Procedure Laterality Date  . CLAVICLE SURGERY    . ORTHOPEDIC SURGERY  2014   Metal placement in rght hand and left shoulder      Social History:     Social History  Substance Use Topics  . Smoking status: Current Every Day Smoker    Packs/day: 0.75    Types: Cigarettes  . Smokeless tobacco: Never Used  . Alcohol use No     Lives - at home  Mobility - walks by self  Family History :    History reviewed. No pertinent family history. Pt can't recall   Home Medications:   Prior to Admission medications   Medication Sig Start Date End Date Taking? Authorizing Provider  acamprosate (CAMPRAL) 333 MG tablet Take 2 tablets (666 mg total) by mouth 3 (three) times daily with meals. For alcohol cravings 09/11/15   Armandina StammerNwoko, Agnes I, NP  diazepam (VALIUM) 5 MG tablet Take 1 tablet (5 mg total) by mouth every 8 (eight) hours as needed for muscle spasms. 01/01/17   Trixie DredgeWest, Emily, PA-C  escitalopram (LEXAPRO) 20 MG tablet Take 1 tablet (20 mg total) by mouth daily. For depression 09/11/15   Armandina StammerNwoko, Agnes I, NP  gabapentin (NEURONTIN) 400 MG capsule Take 1 capsule (400 mg)  three times daily & 1 capsule (400 mg) at bedtime: For agitation 09/11/15   Armandina StammerNwoko, Agnes I, NP  ibuprofen (ADVIL,MOTRIN) 800 MG tablet Take 1 tablet (800 mg total) by mouth every 8 (eight) hours as needed for mild pain or moderate pain. 01/01/17   Trixie DredgeWest, Emily, PA-C  lidocaine (LIDODERM) 5 % Place 1 patch onto the skin daily. Remove & Discard patch within 12 hours or as directed by MD 01/01/17   Trixie DredgeWest, Emily, PA-C  mirtazapine (REMERON) 15 MG tablet Take 1 tablet (15 mg total) by mouth at bedtime. For depression/insomnia 09/11/15   Sanjuana KavaNwoko, Agnes I, NP     Allergies:     Allergies  Allergen Reactions  . Haloperidol And Related Other (See Comments)    Reaction:  Causes pts joints to lock     Physical Exam:   Vitals  Blood pressure 99/66, pulse 72, temperature 97.9 F (36.6 C), temperature source Oral, resp. rate 20, height 6\' 1"  (1.854 m), weight 68 kg (150 lb), SpO2 99 %.   1. General lying in bed in NAD,    2. Normal affect and insight, Not Suicidal or Homicidal, Awake Alert, Oriented X 3.  3. No F.N deficits, ALL C.Nerves Intact, Strength 5/5 all 4 extremities, Sensation intact all 4 extremities, Plantars down going.  4. Ears and Eyes appear Normal, Conjunctivae clear, PERRLA. Moist Oral Mucosa.  5.  Supple Neck, No JVD, No cervical lymphadenopathy appriciated, No Carotid Bruits.  6. Symmetrical Chest wall movement, Good air movement bilaterally, CTAB.  7. RRR, No Gallops, Rubs or Murmurs, No Parasternal Heave.  8. Positive Bowel Sounds, Abdomen Soft, No tenderness, No organomegaly appriciated,No rebound -guarding or rigidity.  9.  No Cyanosis, Normal Skin Turgor, No Skin Rash or Bruise.  10. Good muscle tone,  joints appear normal , no effusions, Normal ROM.  11. No Palpable Lymph Nodes in Neck or Axillae  No palmar erythema,  + tatoo  Data Review:    CBC  Recent Labs Lab 08/01/17 0406  WBC 5.6  HGB 13.7  HCT 39.0  PLT 184  MCV 91.5  MCH 32.2  MCHC 35.1  RDW 12.6   ------------------------------------------------------------------------------------------------------------------  Chemistries   Recent Labs Lab 08/01/17 0406  NA 140  K 3.6  CL 106  CO2 17*  GLUCOSE 134*  BUN 7  CREATININE 0.93  CALCIUM 8.8*  AST 31  ALT 14*  ALKPHOS 42  BILITOT 1.2   ------------------------------------------------------------------------------------------------------------------ estimated creatinine clearance is 105.6 mL/min (by C-G formula based on SCr of 0.93 mg/dL). ------------------------------------------------------------------------------------------------------------------ No results for input(s): TSH, T4TOTAL, T3FREE, THYROIDAB in the last 72 hours.  Invalid input(s): FREET3  Coagulation profile No results for input(s): INR, PROTIME in the last 168 hours. ------------------------------------------------------------------------------------------------------------------- No results for input(s): DDIMER in the last 72 hours. -------------------------------------------------------------------------------------------------------------------  Cardiac Enzymes No results for input(s): CKMB, TROPONINI, MYOGLOBIN in the last 168 hours.  Invalid input(s):  CK ------------------------------------------------------------------------------------------------------------------ No results found for: BNP   ---------------------------------------------------------------------------------------------------------------  Urinalysis    Component Value Date/Time   COLORURINE STRAW (A) 08/01/2017 0408   APPEARANCEUR CLEAR 08/01/2017 0408   LABSPEC 1.009 08/01/2017 0408   PHURINE 6.0 08/01/2017 0408   GLUCOSEU NEGATIVE 08/01/2017 0408   HGBUR NEGATIVE 08/01/2017 0408   BILIRUBINUR NEGATIVE 08/01/2017 0408   KETONESUR NEGATIVE 08/01/2017 0408   PROTEINUR NEGATIVE 08/01/2017 0408   NITRITE NEGATIVE 08/01/2017 0408   LEUKOCYTESUR NEGATIVE 08/01/2017 0408    ----------------------------------------------------------------------------------------------------------------   Imaging Results:    No results found. nsr at 2198,  nl axis, prolonged qtc, no st-t changes c/w ischemia   Assessment & Plan:    Principal Problem:   Tylenol overdose Active Problems:   Severe alcohol use disorder (HCC)    Tylenol overdose Check tylenol level at noon Check cmp at noon,  Check inr at noon n acetylcysteine iv  Etoh abuse [place]  ? Depression /si Please call psychatriaty consult this am 1:1 sitter   DVT Prophylaxis  SCDs   AM Labs Ordered, also please review Full Orders  Family Communication: Admission, patients condition and plan of care including tests being ordered have been discussed with the patient  who indicate understanding and agree with the plan and Code Status.  Code Status FULL CODE  Likely DC to  home  Condition GUARDED   Consults called: none  Admission status: inpatient   Time spent in minutes : 45 minutes   Pearson Grippe M.D on 08/01/2017 at 6:21 AM  Between 7am to 7pm - Pager - [phone]. After 7pm go to [email or web address] - password [facility]  [facility] - Office  [phone]

## 2017-08-01 NOTE — Progress Notes (Signed)
CRITICAL VALUE ALERT  Critical Value:  MRSA positive  Date & Time Notied:  08/01/17 1300  Provider Notified: Dr. Arbutus Leasat  Orders Received/Actions taken: Contact Precautions.

## 2017-08-01 NOTE — ED Triage Notes (Signed)
Patient arrives by EMS ventilations being assisted with nasal trumpet in place right nare. EMS states brother called EMS because patient was suicidal-and had taken ~ 1/2 bottle-250 caplets of 500 mg tylenol at 0100 and drank 3 40 ounce beers. Also smoked crack cocaine earlier today-attempting to cut himself with a razor blade. Patient combative with EMS and [employer]-administered Haldol 5 mg IM and Versed 5 mg IM-CBG 128.

## 2017-08-02 DIAGNOSIS — T1491XA Suicide attempt, initial encounter: Secondary | ICD-10-CM | POA: Diagnosis not present

## 2017-08-02 DIAGNOSIS — F1420 Cocaine dependence, uncomplicated: Secondary | ICD-10-CM | POA: Diagnosis not present

## 2017-08-02 DIAGNOSIS — F1994 Other psychoactive substance use, unspecified with psychoactive substance-induced mood disorder: Secondary | ICD-10-CM

## 2017-08-02 DIAGNOSIS — F332 Major depressive disorder, recurrent severe without psychotic features: Secondary | ICD-10-CM | POA: Diagnosis not present

## 2017-08-02 LAB — CBC
HEMATOCRIT: 35.7 % — AB (ref 39.0–52.0)
Hemoglobin: 12.3 g/dL — ABNORMAL LOW (ref 13.0–17.0)
MCH: 31.5 pg (ref 26.0–34.0)
MCHC: 34.5 g/dL (ref 30.0–36.0)
MCV: 91.3 fL (ref 78.0–100.0)
PLATELETS: 145 10*3/uL — AB (ref 150–400)
RBC: 3.91 MIL/uL — AB (ref 4.22–5.81)
RDW: 12.8 % (ref 11.5–15.5)
WBC: 8 10*3/uL (ref 4.0–10.5)

## 2017-08-02 LAB — COMPREHENSIVE METABOLIC PANEL
ALT: 16 U/L — ABNORMAL LOW (ref 17–63)
AST: 21 U/L (ref 15–41)
Albumin: 3.5 g/dL (ref 3.5–5.0)
Alkaline Phosphatase: 27 U/L — ABNORMAL LOW (ref 38–126)
Anion gap: 7 (ref 5–15)
BILIRUBIN TOTAL: 1.6 mg/dL — AB (ref 0.3–1.2)
BUN: 5 mg/dL — ABNORMAL LOW (ref 6–20)
CHLORIDE: 113 mmol/L — AB (ref 101–111)
CO2: 21 mmol/L — ABNORMAL LOW (ref 22–32)
CREATININE: 0.65 mg/dL (ref 0.61–1.24)
Calcium: 8.6 mg/dL — ABNORMAL LOW (ref 8.9–10.3)
Glucose, Bld: 101 mg/dL — ABNORMAL HIGH (ref 65–99)
POTASSIUM: 3.7 mmol/L (ref 3.5–5.1)
Sodium: 141 mmol/L (ref 135–145)
TOTAL PROTEIN: 5.7 g/dL — AB (ref 6.5–8.1)

## 2017-08-02 LAB — HEMOGLOBIN A1C
HEMOGLOBIN A1C: 5.2 % (ref 4.8–5.6)
Mean Plasma Glucose: 103 mg/dL

## 2017-08-02 LAB — TROPONIN I: Troponin I: <0.03 ng/mL (ref <0.03)

## 2017-08-02 LAB — MAGNESIUM: MAGNESIUM: 1.7 mg/dL (ref 1.7–2.4)

## 2017-08-02 LAB — ACETAMINOPHEN LEVEL: Acetaminophen (Tylenol), Serum: <10 ug/mL — ABNORMAL LOW (ref 10–30)

## 2017-08-02 LAB — PROTIME-INR
INR: 1.26
PROTHROMBIN TIME: 15.9 s — AB (ref 11.4–15.2)

## 2017-08-02 LAB — BILIRUBIN, FRACTIONATED(TOT/DIR/INDIR)
BILIRUBIN INDIRECT: 1.3 mg/dL — AB (ref 0.3–0.9)
Bilirubin, Direct: 0.2 mg/dL (ref 0.1–0.5)
Total Bilirubin: 1.5 mg/dL — ABNORMAL HIGH (ref 0.3–1.2)

## 2017-08-02 LAB — TSH: TSH: 0.902 u[IU]/mL (ref 0.350–4.500)

## 2017-08-02 MED ORDER — POTASSIUM CHLORIDE IN NACL 20-0.9 MEQ/L-% IV SOLN
INTRAVENOUS | Status: AC
  Administered 2017-08-02: 10:00:00 via INTRAVENOUS
  Filled 2017-08-02: qty 1000

## 2017-08-02 MED ORDER — POTASSIUM CHLORIDE CRYS ER 20 MEQ PO TBCR
30.0000 meq | EXTENDED_RELEASE_TABLET | Freq: Once | ORAL | Status: AC
  Administered 2017-08-02: 11:00:00 30 meq via ORAL
  Filled 2017-08-02: qty 1

## 2017-08-02 MED ORDER — MAGNESIUM SULFATE 2 GM/50ML IV SOLN
2.0000 g | Freq: Once | INTRAVENOUS | Status: AC
  Administered 2017-08-02: 2 g via INTRAVENOUS
  Filled 2017-08-02: qty 50

## 2017-08-02 NOTE — Progress Notes (Signed)
Pt was reclining in bed when I arrived; he was alert and awake. He immediately expressed anxiety about being discharged tomorrow and going to his job. He said he wasn't sure his boss would give him another chance. He said he had been clean 18 mos and admitted he had taken it for granted, got "the big head" and thought he could have a beer once or 2x p/wk and be ok. He said it lead to him drinking more frequently and going to few AA meetings and not staying in touch w/his sponsor. He said had been drinking and felt bad for drinking and frustrated and disappointed in himself so he ate a bottle of tylenol. (he did not specify how many)  Pt and I talked about what worked for him for 18 mos. We talked about his plan once he is discharged. Pt admitted he needs to be consistent w/his sponsor, going to meetings and should not take sobriety for granted and realize he needs ongoing support. Pt is just anxious for his job. He said he could find another job, probably the same day. 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 Pt seems to feel badly about disappointing himself and his boss and company. Pt said [place] a [place] and that prayer would be helpful and said that AA relies upon Scriptures. CH provided spiritual as well as listening/pastoral support to pt and prayer. Please page if additional assistance is needed. [phone]Chaplain Elmarie Shileyamela Carrington [place] FlatsHolder, [place]  [phone]   08/02/17 1800  Clinical Encounter Type  Visited With Patient

## 2017-08-02 NOTE — Progress Notes (Signed)
PROGRESS NOTE  Wynetta Finesony Deliz ZOX:[id number] DOB: 07-26-1981 DOA: 08/01/2017 PCP: Patient, No Pcp Per   Brief History:  36 year old male with a history of polysubstance abuse, depression with previous suicidal attempts presented via EMS after his brother called secondary to the patient's suicidal ideation. Apparently, the patient was tried to cut himself with a razor. Upon EMS arrival, the patient was agitated with a heart rate in the 150s. The patient was given Haldol 5 mg IM, and Versed 5 mg IM. He was somnolent at the time of evaluation in emergency department, but he was arousable to voice and opened his eyes and moved his extremities spontaneously. The patient reportedly took 50 tablets of acetaminophen and his suicide attempt. Poison control was contacted by the ED, and they recommended starting the patient on IV N-acetylcysteine as the patient's Tylenol level was 236 with uncertain time of ingestion. The patient was preceded by EDP, Dr. Elesa MassedWard. Initial ALT was 14 with INR 1.03,  The patient was admitted for further evaluation and treatment.  Notably, the patient states that he has not followed up with psychiatry for over 5 years. He states that he has stopped taking all his medications.  Assessment/Plan: Intentional acetaminophen overdose -Continue N-acetylcysteine protocol--stopped am 08/02/17 -continue monitoring LFTs and INR -Continue fluid hydration -Symptomatically treatment for nausea and vomiting-->improved -advance diet -personally reviewed EKG--sinus, QTc 399, QRS 102 ms, no AV block  Alcohol abuse -Patient states that he drinks 15-18 beers daily -Last alcohol drink was 08/01/2017 1 AM  -alcohol withdrawal protocol--no signs of withdrawal -alcohol level 63 at time of admission  Depression/suicidal ideation  -Continue one-on-one sitter  -appreciate psychiatry consult-->does not need inpatient care--pt cleared  Polysubstance abuse  -Including tobacco, cocaine and  cannabis  -Patient states that he uses 2-3 times per week   Hyperglycemia -check A1C--results pending  Sinus bradycardia -no AV blocks, no pauses  Disposition Plan:   Home 08/03/17 if stable Family Communication:   No Family at bedside   Consultants:  psychiatry  Code Status:  FULL  DVT Prophylaxis:  SCDs   Procedures: As Listed in Progress Note Above  Antibiotics: None    Subjective: Patient denies fevers, chills, headache, chest pain, dyspnea, nausea, vomiting, diarrhea, abdominal pain, dysuria, hematuria, hematochezia, and melena.   Objective: Vitals:   08/02/17 0500 08/02/17 0605 08/02/17 0700 08/02/17 0755  BP: (!) 130/99 133/69    Pulse: (!) 43 (!) 46 77   Resp: 13 19 13    Temp:  98 F (36.7 C)  98.6 F (37 C)  TempSrc:  Oral  Oral  SpO2: 96% 96% 97%   Weight:  69 kg (152 lb 1.9 oz)    Height:        Intake/Output Summary (Last 24 hours) at 08/02/17 0939 Last data filed at 08/02/17 0300  Gross per 24 hour  Intake             5704 ml  Output              900 ml  Net             4804 ml   Weight change: -0.94 kg (-2 lb 1.1 oz) Exam:   General:  Pt is alert, follows commands appropriately, not in acute distress  HEENT: No icterus, No thrush, No neck mass, [place]/AT  Cardiovascular: RRR, S1/S2, no rubs, no gallops  Respiratory: CTA bilaterally, no wheezing, no crackles, no rhonchi  Abdomen: Soft/+BS, non  tender, non distended, no guarding  Extremities: No edema, No lymphangitis, No petechiae, No rashes, no synovitis   Data Reviewed: I have personally reviewed following labs and imaging studies Basic Metabolic Panel:  Recent Labs Lab 08/01/17 0406 08/01/17 1149 08/02/17 0204 08/02/17 0808  NA 140 140 141  --   K 3.6 3.5 3.7  --   CL 106 111 113*  --   CO2 17* 21* 21*  --   GLUCOSE 134* 118* 101*  --   BUN 7 6 5*  --   CREATININE 0.93 0.72 0.65  --   CALCIUM 8.8* 8.1* 8.6*  --   MG  --   --   --  1.7   Liver Function  Tests:  Recent Labs Lab 08/01/17 0406 08/01/17 1149 08/02/17 0204  AST 31 19 21   ALT 14* 12* 16*  ALKPHOS 42 36* 27*  BILITOT 1.2 0.9 1.6*  PROT 7.2 6.3* 5.7*  ALBUMIN 4.2 3.6 3.5   No results for input(s): LIPASE, AMYLASE in the last 168 hours. No results for input(s): AMMONIA in the last 168 hours. Coagulation Profile:  Recent Labs Lab 08/01/17 0608 08/01/17 1149 08/02/17 0204  INR 1.03 1.24 1.26   CBC:  Recent Labs Lab 08/01/17 0406 08/02/17 0204  WBC 5.6 8.0  HGB 13.7 12.3*  HCT 39.0 35.7*  MCV 91.5 91.3  PLT 184 145*   Cardiac Enzymes:  Recent Labs Lab 08/01/17 0408 08/01/17 1955 08/02/17 0204 08/02/17 0808  CKTOTAL 250  --   --   --   TROPONINI  --  <0.03 <0.03 <0.03   BNP: Invalid input(s): POCBNP CBG:  Recent Labs Lab 08/01/17 0425  GLUCAP 115*   HbA1C: No results for input(s): HGBA1C in the last 72 hours. Urine analysis:    Component Value Date/Time   COLORURINE STRAW (A) 08/01/2017 0408   APPEARANCEUR CLEAR 08/01/2017 0408   LABSPEC 1.009 08/01/2017 0408   PHURINE 6.0 08/01/2017 0408   GLUCOSEU NEGATIVE 08/01/2017 0408   HGBUR NEGATIVE 08/01/2017 0408   BILIRUBINUR NEGATIVE 08/01/2017 0408   KETONESUR NEGATIVE 08/01/2017 0408   PROTEINUR NEGATIVE 08/01/2017 0408   NITRITE NEGATIVE 08/01/2017 0408   LEUKOCYTESUR NEGATIVE 08/01/2017 0408   Sepsis Labs: @LABRCNTIP (procalcitonin:4,lacticidven:4) ) Recent Results (from the past 240 hour(s))  MRSA PCR Screening     Status: Abnormal   Collection Time: 08/01/17  8:07 AM  Result Value Ref Range Status   MRSA by PCR POSITIVE (A) NEGATIVE Final    Comment:        The GeneXpert MRSA Assay (FDA approved for NASAL specimens only), is one component of a comprehensive MRSA colonization surveillance program. It is not intended to diagnose MRSA infection nor to guide or monitor treatment for MRSA infections. RESULT CALLED TO, READ BACK BY AND VERIFIED WITH: CUFFMAN D RN AT 1305 ON  08/01/17 BY INGLEE      Scheduled Meds: . escitalopram  20 mg Oral Daily  . gabapentin  400 mg Oral TID  . potassium chloride  30 mEq Oral Once   Continuous Infusions: . 0.9 % NaCl with KCl 20 mEq / L    . magnesium sulfate 1 - 4 g bolus IVPB      Procedures/Studies: No results found.  Jaysion Ramseyer, DO  [facility] Pager [phone]  If 7PM-7AM, please contact night-coverage [email or web address] Password [email or web address] 08/02/2017, 9:39 AM   LOS: 1 day

## 2017-08-02 NOTE — Progress Notes (Signed)
Poison Control recommends: Perform EKG. If QTC is on high side of 400 or greater,  add mag to labs and optimize K+ and  Mg+ to high side of normal. If QRS is 120 > or greater, recommendation is 1-2 mEq/kg Bicarb bolus (NOT DRIP). Stop acetylcysteine.

## 2017-08-03 DIAGNOSIS — F332 Major depressive disorder, recurrent severe without psychotic features: Secondary | ICD-10-CM | POA: Diagnosis not present

## 2017-08-03 DIAGNOSIS — T1491XA Suicide attempt, initial encounter: Secondary | ICD-10-CM | POA: Diagnosis not present

## 2017-08-03 DIAGNOSIS — F419 Anxiety disorder, unspecified: Secondary | ICD-10-CM

## 2017-08-03 DIAGNOSIS — F1920 Other psychoactive substance dependence, uncomplicated: Secondary | ICD-10-CM | POA: Diagnosis not present

## 2017-08-03 DIAGNOSIS — R5381 Other malaise: Secondary | ICD-10-CM | POA: Diagnosis not present

## 2017-08-03 DIAGNOSIS — F1010 Alcohol abuse, uncomplicated: Secondary | ICD-10-CM | POA: Diagnosis not present

## 2017-08-03 DIAGNOSIS — R5383 Other fatigue: Secondary | ICD-10-CM

## 2017-08-03 DIAGNOSIS — F1994 Other psychoactive substance use, unspecified with psychoactive substance-induced mood disorder: Secondary | ICD-10-CM | POA: Diagnosis not present

## 2017-08-03 DIAGNOSIS — F17210 Nicotine dependence, cigarettes, uncomplicated: Secondary | ICD-10-CM | POA: Diagnosis not present

## 2017-08-03 DIAGNOSIS — T391X1A Poisoning by 4-Aminophenol derivatives, accidental (unintentional), initial encounter: Secondary | ICD-10-CM | POA: Diagnosis not present

## 2017-08-03 LAB — COMPREHENSIVE METABOLIC PANEL
ALK PHOS: 39 U/L (ref 38–126)
ALT: 14 U/L — AB (ref 17–63)
AST: 17 U/L (ref 15–41)
Albumin: 3.6 g/dL (ref 3.5–5.0)
Anion gap: 5 (ref 5–15)
BUN: 7 mg/dL (ref 6–20)
CALCIUM: 8.5 mg/dL — AB (ref 8.9–10.3)
CO2: 25 mmol/L (ref 22–32)
CREATININE: 0.67 mg/dL (ref 0.61–1.24)
Chloride: 111 mmol/L (ref 101–111)
Glucose, Bld: 105 mg/dL — ABNORMAL HIGH (ref 65–99)
Potassium: 3.9 mmol/L (ref 3.5–5.1)
Sodium: 141 mmol/L (ref 135–145)
Total Bilirubin: 0.4 mg/dL (ref 0.3–1.2)
Total Protein: 5.9 g/dL — ABNORMAL LOW (ref 6.5–8.1)

## 2017-08-03 LAB — MAGNESIUM: Magnesium: 1.8 mg/dL (ref 1.7–2.4)

## 2017-08-03 MED ORDER — CHLORHEXIDINE GLUCONATE CLOTH 2 % EX PADS
6.0000 | MEDICATED_PAD | Freq: Every day | CUTANEOUS | Status: DC
  Administered 2017-08-03 – 2017-08-04 (×2): 6 via TOPICAL

## 2017-08-03 MED ORDER — MUPIROCIN 2 % EX OINT
1.0000 "application " | TOPICAL_OINTMENT | Freq: Two times a day (BID) | CUTANEOUS | Status: DC
  Administered 2017-08-03 (×2): 1 via NASAL
  Filled 2017-08-03: qty 22

## 2017-08-03 NOTE — Clinical Social Work Note (Signed)
Clinical Social Work Assessment  Patient Details  Name: Rodney Fernandez MRN: [id number] Date of Birth: [birth year]  Date of referral:  08/03/17               Reason for consult:  Facility Placement                Permission sought to share information with:    Permission granted to share information::     Name::        Agency::   Residential Treatment  Relationship::   Brother   Contact Information:   [phone]  Housing/Transportation Living arrangements for the past 2 months:  Apartment Source of Information:  Patient Patient Interpreter Needed:  None Criminal Activity/Legal Involvement Pertinent to Current Situation/Hospitalization:  No  Significant Relationships:  Siblings Lives with:  Siblings Do you feel safe going back to the place where you live?  Yes Need for family participation in patient care:  Yes, pt. Reports his brother is very supportive.   Care giving concerns:  Substance Induced Mood Disorder. Suicide attempt.    Social Worker assessment / plan: CSW and psychiatrist met with patient at bedside, explain role and reason for visit. Patient reports feeling bad and upset about his relapse two months ago. Patient reports he was sober for two years attending both AA&NA groups weekly. Patient reports over the past two months he has thought about ending life. Patient states, " I am ashamed of what others will think about me since my relapse." Patient reports he started off having one drink with a meal, then started drinking every night and then using illicit drugs. Patient reports this Friday/Saturday morning he took a bottle of 50 pills of tylenol and then his brother called emergency crews. Patient reports the last time he was at [facility] was in 2016 for detox and then was transferred to [facility] residential treatment facility. Patient is agreeable to receiving treatment at this time. Denies SI/HI at this time.   Plan: Follow Psychiatrist Recommendations-Residential Treatment at  [facility].CSW spoke with Benjamine Mola and set earliest appointment for 8/7 at 7:45am transfer. CSW left taxi voucher for patient to discharge in the a.m directly to [facility]. Patient mother will bring pt. belongings to the facility. Patient reports understanding plan. Nurse notified to call for taxi at 7:15am for transport. Physician notified.   Employment status:  [employer] information:  Self Pay (Medicaid Pending) PT Recommendations:  Not assessed at this time Information / Referral to community resources:  Residential Substance Abuse Treatment Options  Patient/Family's Response to care: Patient reports he was scared to discharge without any support. Patient appreciative of CSW services.   Patient/Family's Understanding of and Emotional Response to Diagnosis, Current Treatment, and Prognosis: Patient gave CSW permission to speak with his brother for collateral information. Patient brother reports he called the emergency crews to their apartment after patient stated he wanted to kill himself and reported he took pills. He reports, "this all happen out of the blue" he has not done something like this in years. Patient brother request to be updated about patient care and transition when medically stable.   Emotional Assessment Appearance:  Developmentally appropriate. Attitude/Demeanor/Rapport:    Affect (typically observed):  Depressed, Accepting Orientation:  Oriented to Self, Oriented to Place, Oriented to  Time, Oriented to Situation Alcohol / Substance use:  Not Applicable Psych involvement (Current and /or in the community):  Yes Discharge Needs  Concerns to be addressed:  Discharge Planning Concerns, Care Coordination Readmission  within the last 30 days:  Yes Current discharge risk:  None Barriers to Discharge:  Active Substance Use   Lia Hopping, LCSW 08/03/2017, 1:01 PM

## 2017-08-03 NOTE — Consult Note (Signed)
[facility] Consult   Reason for Consult:  SI-overdosed on Tylenol Referring Physician:  Dr.Tat Patient Identification: Rodney Fernandez MRN:  [id number] Principal Diagnosis: Substance induced mood disorder [facility]) Diagnosis:   Patient Active Problem List   Diagnosis Date Noted  . Tylenol overdose [T39.1X1A] 08/01/2017  . Alcohol abuse [F10.10] 08/01/2017  . Substance induced mood disorder ([place]) [F19.94] 08/01/2017  . Polysubstance dependence ([place]) [F19.20] 08/01/2017  . Suicide attempt ([place]) [T14.91XA]   . Cocaine use disorder, moderate, dependence ([place]) [F14.20] 09/07/2015  . Severe recurrent major depression without psychotic features ([place]) [F33.2] 09/01/2015  . Alcohol use disorder, severe, dependence ([place]) [F10.20] 09/01/2015  . Severe alcohol use disorder ([place]) [F10.20] 09/01/2015    Total Time spent with patient: 45 minutes  Subjective:   Rodney Fernandez is a 36 y.o. male patient admitted with suicide attempt by overdosing on Tylenol.  HPI:  Rodney Fernandez is a 36 years old male admitted to [facility] with the status post intentional Tylenol overdose as a suicide attempt. Patient reportedly been depressed secondary to polysubstance abuse and recent relapse on drugs of abuse after 18 months sober. Patient has been under significant psychosocial stresses related to finances unable to pay his bills. Patient is also worried about not able to get any support when discharged from the hospital and started talking about possible suicidal thoughts. Patient contract for safety during this evaluation and also willing to participate in outpatient medication management for depression and willing to be referred to residential substance abuse treatment program. Patient has a history of participating in [facility] about 2 years ago. Patient reportedly has heard good things [facility] so [facility]'ll [facility].  Past Psychiatric History: as  above  Risk to Self: Is patient at risk for suicide?: Denies Risk to Others:   Prior Inpatient Therapy:   Prior Outpatient Therapy:    Past Medical History:  Past Medical History:  Diagnosis Date  . Pneumothorax 2014    Past Surgical History:  Procedure Laterality Date  . CLAVICLE SURGERY    . ORTHOPEDIC SURGERY  2014   Metal placement in rght hand and left shoulder   Family History: History reviewed. No pertinent family history. Family Psychiatric  History:  Social History:  History  Alcohol Use No     History  Drug Use No    Comment: Denies (01/01/17)    Social History   Social History  . Marital status: Single    Spouse name: N/A  . Number of children: N/A  . Years of education: N/A   Social History Main Topics  . Smoking status: Current Every Day Smoker    Packs/day: 0.75    Types: Cigarettes  . Smokeless tobacco: Never Used  . Alcohol use No  . Drug use: No     Comment: Denies (01/01/17)  . Sexual activity: Yes   Other Topics Concern  . None   Social History Narrative  . None   Additional Social History:    Allergies:   Allergies  Allergen Reactions  . Haloperidol And Related Other (See Comments)    Reaction:  Causes pts joints to lock    Labs:  Results for orders placed or performed during the hospital encounter of 08/01/17 (from the past 48 hour(s))  Comprehensive metabolic panel     Status: Abnormal   Collection Time: 08/01/17 11:49 AM  Result Value Ref Range   Sodium 140 135 - 145 mmol/L   Potassium 3.5 3.5 -  5.1 mmol/L   Chloride 111 101 - 111 mmol/L   CO2 21 (L) 22 - 32 mmol/L   Glucose, Bld 118 (H) 65 - 99 mg/dL   BUN 6 6 - 20 mg/dL   Creatinine, Ser 0.72 0.61 - 1.24 mg/dL   Calcium 8.1 (L) 8.9 - 10.3 mg/dL   Total Protein 6.3 (L) 6.5 - 8.1 g/dL   Albumin 3.6 3.5 - 5.0 g/dL   AST 19 15 - 41 U/L   ALT 12 (L) 17 - 63 U/L   Alkaline Phosphatase 36 (L) 38 - 126 U/L   Total Bilirubin 0.9 0.3 - 1.2 mg/dL   GFR calc non Af Amer >60 >60  mL/min   GFR calc Af Amer >60 >60 mL/min    Comment: (NOTE) The eGFR has been calculated using the CKD EPI equation. This calculation has not been validated in all clinical situations. eGFR's persistently <60 mL/min signify possible Chronic Kidney Disease.    Anion gap 8 5 - 15  Protime-INR     Status: Abnormal   Collection Time: 08/01/17 11:49 AM  Result Value Ref Range   Prothrombin Time 15.7 (H) 11.4 - 15.2 seconds   INR 1.24   Acetaminophen level     Status: Abnormal   Collection Time: 08/01/17 11:49 AM  Result Value Ref Range   Acetaminophen (Tylenol), Serum 98 (H) 10 - 30 ug/mL    Comment:        THERAPEUTIC CONCENTRATIONS VARY SIGNIFICANTLY. A RANGE OF 10-30 ug/mL MAY BE AN EFFECTIVE CONCENTRATION FOR MANY PATIENTS. HOWEVER, SOME ARE BEST TREATED AT CONCENTRATIONS OUTSIDE THIS RANGE. ACETAMINOPHEN CONCENTRATIONS >150 ug/mL AT 4 HOURS AFTER INGESTION AND >50 ug/mL AT 12 HOURS AFTER INGESTION ARE OFTEN ASSOCIATED WITH TOXIC REACTIONS.   Hemoglobin A1c     Status: None   Collection Time: 08/01/17 11:49 AM  Result Value Ref Range   Hgb A1c MFr Bld 5.2 4.8 - 5.6 %    Comment: (NOTE)         Pre-diabetes: 5.7 - 6.4         Diabetes: >6.4         Glycemic control for adults with diabetes: <7.0    Mean Plasma Glucose 103 mg/dL    Comment: (NOTE) Performed At: [facility] [place], [place] [id number] Lindon Romp MD KG:[id number]   Troponin I (q 6hr x 3)     Status: None   Collection Time: 08/01/17  7:55 PM  Result Value Ref Range   Troponin I <0.03 <0.03 ng/mL  Comprehensive metabolic panel     Status: Abnormal   Collection Time: 08/02/17  2:04 AM  Result Value Ref Range   Sodium 141 135 - 145 mmol/L   Potassium 3.7 3.5 - 5.1 mmol/L   Chloride 113 (H) 101 - 111 mmol/L   CO2 21 (L) 22 - 32 mmol/L   Glucose, Bld 101 (H) 65 - 99 mg/dL   BUN 5 (L) 6 - 20 mg/dL   Creatinine, Ser 0.65 0.61 - 1.24 mg/dL   Calcium 8.6 (L) 8.9 - 10.3  mg/dL   Total Protein 5.7 (L) 6.5 - 8.1 g/dL   Albumin 3.5 3.5 - 5.0 g/dL   AST 21 15 - 41 U/L   ALT 16 (L) 17 - 63 U/L   Alkaline Phosphatase 27 (L) 38 - 126 U/L   Total Bilirubin 1.6 (H) 0.3 - 1.2 mg/dL   GFR calc non Af Amer >60 >60 mL/min  GFR calc Af Amer >60 >60 mL/min    Comment: (NOTE) The eGFR has been calculated using the CKD EPI equation. This calculation has not been validated in all clinical situations. eGFR's persistently <60 mL/min signify possible Chronic Kidney Disease.    Anion gap 7 5 - 15  CBC     Status: Abnormal   Collection Time: 08/02/17  2:04 AM  Result Value Ref Range   WBC 8.0 4.0 - 10.5 K/uL   RBC 3.91 (L) 4.22 - 5.81 MIL/uL   Hemoglobin 12.3 (L) 13.0 - 17.0 g/dL   HCT 35.7 (L) 39.0 - 52.0 %   MCV 91.3 78.0 - 100.0 fL   MCH 31.5 26.0 - 34.0 pg   MCHC 34.5 30.0 - 36.0 g/dL   RDW 12.8 11.5 - 15.5 %   Platelets 145 (L) 150 - 400 K/uL  Acetaminophen level     Status: Abnormal   Collection Time: 08/02/17  2:04 AM  Result Value Ref Range   Acetaminophen (Tylenol), Serum <10 (L) 10 - 30 ug/mL    Comment:        THERAPEUTIC CONCENTRATIONS VARY SIGNIFICANTLY. A RANGE OF 10-30 ug/mL MAY BE AN EFFECTIVE CONCENTRATION FOR MANY PATIENTS. HOWEVER, SOME ARE BEST TREATED AT CONCENTRATIONS OUTSIDE THIS RANGE. ACETAMINOPHEN CONCENTRATIONS >150 ug/mL AT 4 HOURS AFTER INGESTION AND >50 ug/mL AT 12 HOURS AFTER INGESTION ARE OFTEN ASSOCIATED WITH TOXIC REACTIONS.   Protime-INR     Status: Abnormal   Collection Time: 08/02/17  2:04 AM  Result Value Ref Range   Prothrombin Time 15.9 (H) 11.4 - 15.2 seconds   INR 1.26   Troponin I (q 6hr x 3)     Status: None   Collection Time: 08/02/17  2:04 AM  Result Value Ref Range   Troponin I <0.03 <0.03 ng/mL  TSH     Status: None   Collection Time: 08/02/17  2:04 AM  Result Value Ref Range   TSH 0.902 0.350 - 4.500 uIU/mL    Comment: Performed by a 3rd Generation assay with a functional sensitivity of <=0.01  uIU/mL.  Troponin I (q 6hr x 3)     Status: None   Collection Time: 08/02/17  8:08 AM  Result Value Ref Range   Troponin I <0.03 <0.03 ng/mL  Magnesium     Status: None   Collection Time: 08/02/17  8:08 AM  Result Value Ref Range   Magnesium 1.7 1.7 - 2.4 mg/dL  Bilirubin, fractionated(tot/dir/indir)     Status: Abnormal   Collection Time: 08/02/17  8:08 AM  Result Value Ref Range   Total Bilirubin 1.5 (H) 0.3 - 1.2 mg/dL   Bilirubin, Direct 0.2 0.1 - 0.5 mg/dL   Indirect Bilirubin 1.3 (H) 0.3 - 0.9 mg/dL  Comprehensive metabolic panel     Status: Abnormal   Collection Time: 08/03/17  4:44 AM  Result Value Ref Range   Sodium 141 135 - 145 mmol/L   Potassium 3.9 3.5 - 5.1 mmol/L   Chloride 111 101 - 111 mmol/L   CO2 25 22 - 32 mmol/L   Glucose, Bld 105 (H) 65 - 99 mg/dL   BUN 7 6 - 20 mg/dL   Creatinine, Ser 0.67 0.61 - 1.24 mg/dL   Calcium 8.5 (L) 8.9 - 10.3 mg/dL   Total Protein 5.9 (L) 6.5 - 8.1 g/dL   Albumin 3.6 3.5 - 5.0 g/dL   AST 17 15 - 41 U/L   ALT 14 (L) 17 - 63 U/L   Alkaline  Phosphatase 39 38 - 126 U/L   Total Bilirubin 0.4 0.3 - 1.2 mg/dL   GFR calc non Af Amer >60 >60 mL/min   GFR calc Af Amer >60 >60 mL/min    Comment: (NOTE) The eGFR has been calculated using the CKD EPI equation. This calculation has not been validated in all clinical situations. eGFR's persistently <60 mL/min signify possible Chronic Kidney Disease.    Anion gap 5 5 - 15  Magnesium     Status: None   Collection Time: 08/03/17  4:44 AM  Result Value Ref Range   Magnesium 1.8 1.7 - 2.4 mg/dL    Current Facility-Administered Medications  Medication Dose Route Frequency Provider Last Rate Last Dose  . Chlorhexidine Gluconate Cloth 2 % PADS 6 each  6 each Topical Q0600 Tat, David, MD   6 each at 08/03/17 1000  . escitalopram (LEXAPRO) tablet 20 mg  20 mg Oral Daily Jani Gravel, MD   20 mg at 08/03/17 1100  . gabapentin (NEURONTIN) capsule 400 mg  400 mg Oral TID Jani Gravel, MD   400 mg  at 08/03/17 1100  . mupirocin ointment (BACTROBAN) 2 % 1 application  1 application Nasal BID Tat, Shanon Brow, MD   1 application at 44/31/54 1100  . ondansetron (ZOFRAN) injection 4 mg  4 mg Intravenous Q6H PRN Tat, Shanon Brow, MD   4 mg at 08/01/17 1954    Musculoskeletal: Strength & Muscle Tone: within normal limits Gait & Station: normal Patient leans: N/A  Psychiatric Specialty Exam: Physical Exam  Psychiatric: His behavior is normal. Judgment and thought content normal. His speech is delayed. Cognition and memory are normal. He exhibits a depressed mood.    Review of Systems  Constitutional: Positive for malaise/fatigue.  HENT: Negative.   Eyes: Negative.   Respiratory: Negative.   Cardiovascular: Negative.   Gastrointestinal: Negative.   Genitourinary: Negative.   Musculoskeletal: Negative.   Skin: Negative.   Neurological: Negative.   Endo/Heme/Allergies: Negative.   Psychiatric/Behavioral: Negative.     Blood pressure 136/80, pulse 63, temperature 98.4 F (36.9 C), temperature source Oral, resp. rate 18, height 6' 1"  (1.854 m), weight 67.6 kg (149 lb), SpO2 99 %.Body mass index is 19.66 kg/m.  General Appearance: Casual  Eye Contact:  Good  Speech:  Slow  Volume:  Decreased  Mood:  Dysphoric  Affect:  Constricted  Thought Process:  Coherent and Descriptions of Associations: Intact  Orientation:  Full (Time, Place, and Person)  Thought Content:  Logical  Suicidal Thoughts:  No  Homicidal Thoughts:  No  Memory:  Immediate;   Fair Recent;   Fair Remote;   Good  Judgement:  Intact  Insight:  Fair  Psychomotor Activity:  Decreased  Concentration:  Concentration: Good and Attention Span: Good  Recall:  Good  Fund of Knowledge:  Good  Language:  Good  Akathisia:  No  Handed:  Right  AIMS (if indicated):     Assets:  Communication Skills Desire for Improvement Social Support  ADL's:  Intact  Cognition:  WNL  Sleep:   fair     Treatment Plan Summary: Intentional  acetaminophen drug overdose Polysubstance abuse Substance induced mood disorder Major depressive disorder, recurrent by history  Plan: Lexapro 20 mg daily for depression  Gabapentin 400 mg TID for anxiety Patient is cleared by psychiatric service Case discussed with LCSW and patient will be referred to the [facility] or other residential substance abuse treatment programs.   Disposition: No evidence of imminent  risk to self or others at present.   Patient does not meet criteria for psychiatric inpatient admission. Supportive therapy provided about ongoing stressors. Unit Social worker to assist with referring patient to [facility].  Ambrose Finland, MD 08/03/2017 11:29 AM

## 2017-08-03 NOTE — Progress Notes (Signed)
Patient is A&Ox4 ambulatory without assist. He denies discomfort of any kind. Pt denied SI/HI today. Sitter at bedside, report given

## 2017-08-03 NOTE — Plan of Care (Signed)
Problem: Self-Concept: Goal: Ability to disclose and discuss suicidal ideas will improve Outcome: Completed/Met Date Met: 08/03/17 Patient denies SI at this time.

## 2017-08-03 NOTE — Progress Notes (Signed)
CSW completed IVC rescind" form and Dr. Arbutus Leasat signed it.  CSW faxed rescension form to after hours magistrate and placed copy in pt's chart.  Please reconsult if future social work needs arise.  CSW signing off, as social work intervention is no longer needed.  Rodney PeaJonathan F. Zahari Fernandez, Rodney SorLCSWA, LCAS, CSI Clinical Social Worker Ph: [phone]

## 2017-08-03 NOTE — Progress Notes (Signed)
PROGRESS NOTE  Rodney Fernandez ZOX:[id number] DOB: 01-16-1981 DOA: 08/01/2017 PCP: Patient, No Pcp Per  Brief History:  36 year old male with a history of polysubstance abuse, depression with previous suicidal attempts presented via EMS after his brother called secondary to the patient's suicidal ideation. Apparently, the patient was tried to cut himself with a razor. Upon EMS arrival, the patient was agitated with a heart rate in the 150s. The patient was given Haldol 5 mg IM, and Versed 5 mg IM. He was somnolent at the time of evaluation in emergency department, but he was arousable to voice and opened his eyes and moved his extremities spontaneously. The patient reportedly took 50 tablets of acetaminophen and his suicide attempt. Poison control was contacted by the ED, and they recommended starting the patient on IV N-acetylcysteine as the patient's Tylenol level was 236 with uncertain time of ingestion. The patient was preceded by EDP, Dr. Elesa Massed. Initial ALT was 14 with INR 1.03, The patient was admitted for further evaluation and treatment. Notably, the patient states that he has not followed up with psychiatry for over 5 years. He states that he has stopped taking allhis medications. Psychiatry was consulted during the patient's hospitalization. The patient was seen on 08/01/2017, and the patient was cleared for discharge once medically stable. On 08/03/2017, the patient stated that he was not honest with the psychiatrist, and he continued to have active suicidal ideation. Psychiatry was reconsulted.  Assessment/Plan: Intentional acetaminophen overdose -Continue N-acetylcysteine protocol--stopped am 08/02/17 -continue monitoring LFTs and INR-->remained stable, essentialy normal -Continue fluid hydration -Symptomatically treatment for nausea and vomiting-->improved -advance diet-->tolerated -personally reviewed EKG--sinus, QTc 399, QRS 102 ms, no AV block  Alcohol abuse -Patient states  that he drinks 15-18 beers daily -Last alcohol drink was 08/01/2017 1 AM  -alcohol withdrawal protocol--no signs of withdrawal during the hospitalization -alcohol level 63 at time of admission  Depression/suicidal ideation  -Discontinued one-on-one sitter  -appreciate psychiatry consult-->does not need inpatient care--pt cleared initially -08/03/17--pt states he was not honest with psychiatrist on 8/4, now with continued SI-->resconsult psychitatry--spoke with Dr. Elsie Saas  Polysubstance abuse  -Including tobacco, cocaine and cannabis  -Patient states that he uses 2-3 times per week   Hyperglycemia -check A1C--results pending  Sinus bradycardia -no AV blocks, no pauses -usually occurs when patient is asleep       Disposition Plan:   dispo pending psychiatry reconsult Family Communication:   No Family at bedside  Consultants:  psychiatry  Code Status:  FULL   DVT Prophylaxis:   [place] Lovenox   Procedures: As Listed in Progress Note Above  Antibiotics: None    Subjective: Patient denies fevers, chills, headache, chest pain, dyspnea, nausea, vomiting, diarrhea, abdominal pain, dysuria, hematuria, hematochezia, and melena.   Objective: Vitals:   08/02/17 1219 08/02/17 1342 08/02/17 2114 08/03/17 0427  BP:  118/67 122/82 136/80  Pulse:  60 60 63  Resp:   18 18  Temp: 98.2 F (36.8 C) 98.5 F (36.9 C)  98.4 F (36.9 C)  TempSrc: Oral Oral  Oral  SpO2:  98% 100% 99%  Weight:    67.6 kg (149 lb)  Height:        Intake/Output Summary (Last 24 hours) at 08/03/17 0957 Last data filed at 08/03/17 0835  Gross per 24 hour  Intake           2017.5 ml  Output  0 ml  Net           2017.5 ml   Weight change: 0.486 kg (1 lb 1.1 oz) Exam:   General:  Pt is alert, follows commands appropriately, not in acute distress  HEENT: No icterus, No thrush, No neck mass, [place]/AT  Cardiovascular: RRR, S1/S2, no rubs, no gallops  Respiratory: diminished  BS at bases, but CTA bilaterally, no wheezing, no crackles, no rhonchi  Abdomen: Soft/+BS, non tender, non distended, no guarding  Extremities: No edema, No lymphangitis, No petechiae, No rashes, no synovitis   Data Reviewed: I have personally reviewed following labs and imaging studies Basic Metabolic Panel:  Recent Labs Lab 08/01/17 0406 08/01/17 1149 08/02/17 0204 08/02/17 0808 08/03/17 0444  NA 140 140 141  --  141  K 3.6 3.5 3.7  --  3.9  CL 106 111 113*  --  111  CO2 17* 21* 21*  --  25  GLUCOSE 134* 118* 101*  --  105*  BUN 7 6 5*  --  7  CREATININE 0.93 0.72 0.65  --  0.67  CALCIUM 8.8* 8.1* 8.6*  --  8.5*  MG  --   --   --  1.7 1.8   Liver Function Tests:  Recent Labs Lab 08/01/17 0406 08/01/17 1149 08/02/17 0204 08/02/17 0808 08/03/17 0444  AST 31 19 21   --  17  ALT 14* 12* 16*  --  14*  ALKPHOS 42 36* 27*  --  39  BILITOT 1.2 0.9 1.6* 1.5* 0.4  PROT 7.2 6.3* 5.7*  --  5.9*  ALBUMIN 4.2 3.6 3.5  --  3.6   No results for input(s): LIPASE, AMYLASE in the last 168 hours. No results for input(s): AMMONIA in the last 168 hours. Coagulation Profile:  Recent Labs Lab 08/01/17 0608 08/01/17 1149 08/02/17 0204  INR 1.03 1.24 1.26   CBC:  Recent Labs Lab 08/01/17 0406 08/02/17 0204  WBC 5.6 8.0  HGB 13.7 12.3*  HCT 39.0 35.7*  MCV 91.5 91.3  PLT 184 145*   Cardiac Enzymes:  Recent Labs Lab 08/01/17 0408 08/01/17 1955 08/02/17 0204 08/02/17 0808  CKTOTAL 250  --   --   --   TROPONINI  --  <0.03 <0.03 <0.03   BNP: Invalid input(s): POCBNP CBG:  Recent Labs Lab 08/01/17 0425  GLUCAP 115*   HbA1C:  Recent Labs  08/01/17 1149  HGBA1C 5.2   Urine analysis:    Component Value Date/Time   COLORURINE STRAW (A) 08/01/2017 0408   APPEARANCEUR CLEAR 08/01/2017 0408   LABSPEC 1.009 08/01/2017 0408   PHURINE 6.0 08/01/2017 0408   GLUCOSEU NEGATIVE 08/01/2017 0408   HGBUR NEGATIVE 08/01/2017 0408   BILIRUBINUR NEGATIVE 08/01/2017  0408   KETONESUR NEGATIVE 08/01/2017 0408   PROTEINUR NEGATIVE 08/01/2017 0408   NITRITE NEGATIVE 08/01/2017 0408   LEUKOCYTESUR NEGATIVE 08/01/2017 0408   Sepsis Labs: @LABRCNTIP (procalcitonin:4,lacticidven:4) ) Recent Results (from the past 240 hour(s))  MRSA PCR Screening     Status: Abnormal   Collection Time: 08/01/17  8:07 AM  Result Value Ref Range Status   MRSA by PCR POSITIVE (A) NEGATIVE Final    Comment:        The GeneXpert MRSA Assay (FDA approved for NASAL specimens only), is one component of a comprehensive MRSA colonization surveillance program. It is not intended to diagnose MRSA infection nor to guide or monitor treatment for MRSA infections. RESULT CALLED TO, READ BACK BY AND VERIFIED WITH: CUFFMAN D  RN AT 1305 ON 08/01/17 BY INGLEE      Scheduled Meds: . Chlorhexidine Gluconate Cloth  6 each Topical Q0600  . escitalopram  20 mg Oral Daily  . gabapentin  400 mg Oral TID  . mupirocin ointment  1 application Nasal BID   Continuous Infusions:  Procedures/Studies: No results found.  Natalye Kott, DO  [facility] Pager [phone]  If 7PM-7AM, please contact night-coverage [email or web address] Password [email or web address] 08/03/2017, 9:57 AM   LOS: 2 days

## 2017-08-04 DIAGNOSIS — F1420 Cocaine dependence, uncomplicated: Secondary | ICD-10-CM | POA: Diagnosis not present

## 2017-08-04 DIAGNOSIS — F332 Major depressive disorder, recurrent severe without psychotic features: Secondary | ICD-10-CM | POA: Diagnosis not present

## 2017-08-04 DIAGNOSIS — F1010 Alcohol abuse, uncomplicated: Secondary | ICD-10-CM | POA: Diagnosis not present

## 2017-08-04 DIAGNOSIS — F1994 Other psychoactive substance use, unspecified with psychoactive substance-induced mood disorder: Secondary | ICD-10-CM | POA: Diagnosis not present

## 2017-08-04 MED ORDER — ESCITALOPRAM OXALATE 20 MG PO TABS: 20.0000 mg | ORAL_TABLET | Freq: Every day | ORAL | 0 refills | Status: DC

## 2017-08-04 MED ORDER — ACAMPROSATE CALCIUM 333 MG PO TBEC: 666.0000 mg | DELAYED_RELEASE_TABLET | Freq: Three times a day (TID) | ORAL | 0 refills | Status: DC

## 2017-08-04 NOTE — Discharge Summary (Signed)
Physician Discharge Summary  Rodney Fernandez KYH:[id number] DOB: 11-Jan-1981 DOA: 08/01/2017  PCP: Patient, No Pcp Per  Admit date: 08/01/2017 Discharge date: 08/04/2017  Admitted From: Home Disposition:  [facility]  Recommendations for Outpatient Follow-up:  1. Follow up with PCP in 1-2 weeks 2. Please obtain BMP, LFTs in on week    Discharge Condition: Stable CODE STATUS:FULL Diet recommendation: Regular   Brief/Interim Summary: 36 year old male with a history of polysubstance abuse, depression with previous suicidal attempts presented via EMS after his brother called secondary to the patient's suicidal ideation. Apparently, the patient was tried to cut himself with a razor. Upon EMS arrival, the patient was agitated with a heart rate in the 150s. The patient was given Haldol 5 mg IM, and Versed 5 mg IM. He was somnolent at the time of evaluation in emergency department, but he was arousable to voice and opened his eyes and moved his extremities spontaneously. The patient reportedly took 50 tablets of acetaminophen and his suicide attempt. Poison control was contacted by the ED, and they recommended starting the patient on IV N-acetylcysteine as the patient's Tylenol level was 151 with uncertain time of ingestion. The patient was preceded by EDP, Dr. Leonides Schanz. Initial ALT was 14 with INR 1.03, The patient was admitted for further evaluation and treatment. Notably, the patient states that he has not followed up with psychiatry for over 5 years. He states that he has stopped taking allhis medications. Psychiatry was consulted during the patient's hospitalization. The patient was seen on 08/01/2017, and the patient was cleared for discharge once medically stable. On 08/03/2017, the patient stated that he was not honest with the psychiatrist, and he continued to have active suicidal ideation. Psychiatry was reconsulted.   Again, psychiatry felt, that the patient does not meet criteria  for psychiatric inpatient admission.  Social work assisted in Nurse, mental health to [employer].  Discharge Diagnoses:  Intentional acetaminophen overdose -Continue N-acetylcysteine protocol--stopped am 08/02/17 -continue monitoring LFTs and INR-->remained stable, essentialy normal -Continue fluid hydration -Symptomatically treatment for nausea and vomiting-->improved -advance diet-->tolerated -personally reviewed EKG--sinus, QTc 399, QRS 102 ms, no AV block -personally reviewed telemetry--no pauses or dysrhythmias-->tele ultimately discontinued  Alcohol abuse -Patient states that he drinks 15-18 beers daily -Last alcohol drink was 08/01/2017 1 AM  -alcohol withdrawal protocol--no signs of withdrawal during the hospitalization -alcohol level 63 at time of admission  Depression/suicidal ideation  -Discontinuedone-on-one sitter  -appreciate psychiatry consult-->does not need inpatient care--pt cleared initially -08/03/17--pt states he was not honest with psychiatrist on 8/4, now with continued SI-->resconsult psychitatry--spoke with Dr. Louretta Shorten- -again, psychiatry did not feel pt met criteria for inpatient psychiatric admission -social work then assisted in placement to [place]  Polysubstance abuse  -Including tobacco, cocaine and cannabis  -Patient states that he uses 2-3 times per week   Hyperglycemia -check A1C--5.2  Sinus bradycardia -no AV blocks, no pauses -usually occurs when patient is asleep   Discharge Instructions   Allergies as of 08/04/2017      Reactions   Haloperidol And Related Other (See Comments)   Reaction:  Causes pts joints to lock      Medication List    STOP taking these medications   acetaminophen 500 MG tablet Commonly known as:  TYLENOL   diazepam 5 MG tablet Commonly known as:  VALIUM   gabapentin 400 MG capsule Commonly known as:  NEURONTIN   ibuprofen 800 MG tablet Commonly known as:   ADVIL,MOTRIN   lidocaine 5 %  Commonly known as:  LIDODERM   mirtazapine 15 MG tablet Commonly known as:  REMERON     TAKE these medications   acamprosate 333 MG tablet Commonly known as:  CAMPRAL Take 2 tablets (666 mg total) by mouth 3 (three) times daily with meals. For alcohol cravings   escitalopram 20 MG tablet Commonly known as:  LEXAPRO Take 1 tablet (20 mg total) by mouth daily. For depression      Follow-up [place] Follow up on 08/07/2017.   Why:  Appointment Fri August 10 at 1:00 PM. Please keep this appointment. Take all medications with you and discharge paper work.  Contact information: [place] [phone] [place] [phone]       Services, [facility]. Go on 08/04/2017.   Why:  Appoinment time: 7:45am Please arrive 15 minutes early to complete admissions papers. Please provide your state ID.  Contact information: [address] [place] [address] [phone]          Allergies  Allergen Reactions  . Haloperidol And Related Other (See Comments)    Reaction:  Causes pts joints to lock    Consultations:  psychiatry   Procedures/Studies:  No results found.      Discharge Exam: Vitals:   08/03/17 2228 08/04/17 0606  BP: 118/77 116/77  Pulse: (!) 53 (!) 59  Resp: 16 17  Temp: 98.3 F (36.8 C) 97.9 F (36.6 C)   Vitals:   08/03/17 1501 08/03/17 1834 08/03/17 2228 08/04/17 0606  BP: 124/86 118/68 118/77 116/77  Pulse: 62 64 (!) 53 (!) 59  Resp: 16 18 16 17   Temp: 98.6 F (37 C) 98.4 F (36.9 C) 98.3 F (36.8 C) 97.9 F (36.6 C)  TempSrc: Oral Oral Oral Oral  SpO2: 99% 99% 98% 99%  Weight:      Height:        General: Pt is alert, awake, not in acute distress Cardiovascular: RRR, S1/S2 +, no rubs, no gallops Respiratory: CTA bilaterally, no wheezing, no rhonchi Abdominal: Soft, NT, ND, bowel sounds + Extremities: no edema, no  cyanosis   The results of significant diagnostics from this hospitalization (including imaging, microbiology, ancillary and laboratory) are listed below for reference.    Significant Diagnostic Studies: No results found.   Microbiology: Recent Results (from the past 240 hour(s))  MRSA PCR Screening     Status: Abnormal   Collection Time: 08/01/17  8:07 AM  Result Value Ref Range Status   MRSA by PCR POSITIVE (A) NEGATIVE Final    Comment:        The GeneXpert MRSA Assay (FDA approved for NASAL specimens only), is one component of a comprehensive MRSA colonization surveillance program. It is not intended to diagnose MRSA infection nor to guide or monitor treatment for MRSA infections. RESULT CALLED TO, READ BACK BY AND VERIFIED WITH: CUFFMAN D RN AT 8891 ON 08/01/17 BY INGLEE      Labs: Basic Metabolic Panel:  Recent Labs Lab 08/01/17 0406 08/01/17 1149 08/02/17 0204 08/02/17 0808 08/03/17 0444  NA 140 140 141  --  141  K 3.6 3.5 3.7  --  3.9  CL 106 111 113*  --  111  CO2 17* 21* 21*  --  25  GLUCOSE 134* 118* 101*  --  105*  BUN 7 6 5*  --  7  CREATININE 0.93 0.72 0.65  --  0.67  CALCIUM 8.8*  8.1* 8.6*  --  8.5*  MG  --   --   --  1.7 1.8   Liver Function Tests:  Recent Labs Lab 08/01/17 0406 08/01/17 1149 08/02/17 0204 08/02/17 0808 08/03/17 0444  AST 31 19 21   --  17  ALT 14* 12* 16*  --  14*  ALKPHOS 42 36* 27*  --  39  BILITOT 1.2 0.9 1.6* 1.5* 0.4  PROT 7.2 6.3* 5.7*  --  5.9*  ALBUMIN 4.2 3.6 3.5  --  3.6   No results for input(s): LIPASE, AMYLASE in the last 168 hours. No results for input(s): AMMONIA in the last 168 hours. CBC:  Recent Labs Lab 08/01/17 0406 08/02/17 0204  WBC 5.6 8.0  HGB 13.7 12.3*  HCT 39.0 35.7*  MCV 91.5 91.3  PLT 184 145*   Cardiac Enzymes:  Recent Labs Lab 08/01/17 0408 08/01/17 1955 08/02/17 0204 08/02/17 0808  CKTOTAL 250  --   --   --   TROPONINI  --  <0.03 <0.03 <0.03   BNP: Invalid  input(s): POCBNP CBG:  Recent Labs Lab 08/01/17 0425  GLUCAP 115*    Time coordinating discharge:  Greater than 30 minutes  Signed:  Kelita Wallis, DO [facility] Pager: [phone] 08/04/2017, 6:36 AM
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
## 2017-08-06 LAB — HIV ANTIBODY (ROUTINE TESTING W REFLEX): HIV Screen 4th Generation wRfx: NONREACTIVE

## 2017-08-07 ENCOUNTER — Ambulatory Visit: Payer: Self-pay | Admitting: Family Medicine

## 2017-08-07 MED ORDER — TOPIRAMATE 25 MG PO TABS: 25.0000 mg | ORAL_TABLET | Freq: Every day | ORAL | 0 refills | Status: DC

## 2018-05-28 ENCOUNTER — Emergency Department (HOSPITAL_COMMUNITY): Payer: Self-pay

## 2018-05-28 ENCOUNTER — Encounter (HOSPITAL_COMMUNITY): Payer: Self-pay | Admitting: Emergency Medicine

## 2018-05-28 ENCOUNTER — Emergency Department (HOSPITAL_COMMUNITY)
Admission: EM | Admit: 2018-05-28 | Discharge: 2018-05-28 | Disposition: A | Discharge status: Home / Self Care | Payer: Self-pay | Attending: Emergency Medicine | Admitting: Emergency Medicine

## 2018-05-28 DIAGNOSIS — M791 Myalgia, unspecified site: Secondary | ICD-10-CM

## 2018-05-28 DIAGNOSIS — Z79899 Other long term (current) drug therapy: Secondary | ICD-10-CM | POA: Insufficient documentation

## 2018-05-28 DIAGNOSIS — M7918 Myalgia, other site: Secondary | ICD-10-CM | POA: Insufficient documentation

## 2018-05-28 DIAGNOSIS — F17210 Nicotine dependence, cigarettes, uncomplicated: Secondary | ICD-10-CM | POA: Insufficient documentation

## 2018-05-28 MED ORDER — METHOCARBAMOL 500 MG PO TABS: 500.0000 mg | ORAL_TABLET | Freq: Three times a day (TID) | ORAL | 0 refills | Status: AC
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
## 2019-01-03 ENCOUNTER — Emergency Department (HOSPITAL_COMMUNITY)
Admission: EM | Admit: 2019-01-03 | Discharge: 2019-01-04 | Disposition: A | Discharge status: Home / Self Care | Attending: Emergency Medicine | Admitting: Emergency Medicine

## 2019-01-03 ENCOUNTER — Encounter (HOSPITAL_COMMUNITY): Payer: Self-pay | Admitting: Emergency Medicine

## 2019-01-03 DIAGNOSIS — F1910 Other psychoactive substance abuse, uncomplicated: Secondary | ICD-10-CM | POA: Diagnosis present

## 2019-01-03 DIAGNOSIS — F1920 Other psychoactive substance dependence, uncomplicated: Secondary | ICD-10-CM | POA: Insufficient documentation

## 2019-01-03 DIAGNOSIS — F1510 Other stimulant abuse, uncomplicated: Secondary | ICD-10-CM

## 2019-01-03 DIAGNOSIS — F17210 Nicotine dependence, cigarettes, uncomplicated: Secondary | ICD-10-CM | POA: Insufficient documentation

## 2019-01-03 DIAGNOSIS — F1994 Other psychoactive substance use, unspecified with psychoactive substance-induced mood disorder: Secondary | ICD-10-CM | POA: Insufficient documentation

## 2019-01-03 DIAGNOSIS — Z79899 Other long term (current) drug therapy: Secondary | ICD-10-CM | POA: Insufficient documentation

## 2019-01-03 LAB — BASIC METABOLIC PANEL
ANION GAP: 13 (ref 5–15)
BUN: 17 mg/dL (ref 6–20)
CO2: 22 mmol/L (ref 22–32)
Calcium: 9.4 mg/dL (ref 8.9–10.3)
Chloride: 100 mmol/L (ref 98–111)
Creatinine, Ser: 1.19 mg/dL (ref 0.61–1.24)
Glucose, Bld: 94 mg/dL (ref 70–99)
POTASSIUM: 3.6 mmol/L (ref 3.5–5.1)
Sodium: 135 mmol/L (ref 135–145)

## 2019-01-03 LAB — CK: Total CK: 253 U/L (ref 49–397)

## 2019-01-03 LAB — RAPID URINE DRUG SCREEN, HOSP PERFORMED
Amphetamines: POSITIVE — AB
Barbiturates: NOT DETECTED
Benzodiazepines: POSITIVE — AB
Cocaine: POSITIVE — AB
Opiates: NOT DETECTED
Tetrahydrocannabinol: NOT DETECTED

## 2019-01-03 LAB — CBC WITH DIFFERENTIAL/PLATELET
ABS IMMATURE GRANULOCYTES: 0.02 10*3/uL (ref 0.00–0.07)
BASOS ABS: 0 10*3/uL (ref 0.0–0.1)
Basophils Relative: 0 %
EOS ABS: 0 10*3/uL (ref 0.0–0.5)
Eosinophils Relative: 0 %
HEMATOCRIT: 44.6 % (ref 39.0–52.0)
HEMOGLOBIN: 15.1 g/dL (ref 13.0–17.0)
IMMATURE GRANULOCYTES: 0 %
LYMPHS ABS: 1.2 10*3/uL (ref 0.7–4.0)
Lymphocytes Relative: 15 %
MCH: 31.3 pg (ref 26.0–34.0)
MCHC: 33.9 g/dL (ref 30.0–36.0)
MCV: 92.5 fL (ref 80.0–100.0)
Monocytes Absolute: 0.8 10*3/uL (ref 0.1–1.0)
Monocytes Relative: 11 %
NRBC: 0 % (ref 0.0–0.2)
Neutro Abs: 5.8 10*3/uL (ref 1.7–7.7)
Neutrophils Relative %: 74 %
Platelets: 163 10*3/uL (ref 150–400)
RBC: 4.82 MIL/uL (ref 4.22–5.81)
RDW: 12.3 % (ref 11.5–15.5)
WBC: 7.9 10*3/uL (ref 4.0–10.5)

## 2019-01-03 LAB — URINALYSIS, ROUTINE W REFLEX MICROSCOPIC
Bilirubin Urine: NEGATIVE
Glucose, UA: NEGATIVE mg/dL
HGB URINE DIPSTICK: NEGATIVE
KETONES UR: 5 mg/dL — AB
LEUKOCYTES UA: NEGATIVE
Nitrite: NEGATIVE
PROTEIN: NEGATIVE mg/dL
Specific Gravity, Urine: 1.014 (ref 1.005–1.030)
pH: 6 (ref 5.0–8.0)

## 2019-01-03 LAB — ETHANOL: Alcohol, Ethyl (B): <10 mg/dL (ref <10)

## 2019-01-03 LAB — SALICYLATE LEVEL

## 2019-01-03 LAB — ACETAMINOPHEN LEVEL

## 2019-01-03 MED ORDER — OLANZAPINE 10 MG IM SOLR
10.0000 mg | Freq: Once | INTRAMUSCULAR | Status: AC
  Administered 2019-01-03: 10 mg via INTRAMUSCULAR
  Filled 2019-01-03: qty 10

## 2019-01-03 MED ORDER — DIPHENHYDRAMINE HCL 50 MG/ML IJ SOLN
50.0000 mg | Freq: Once | INTRAMUSCULAR | Status: AC
  Administered 2019-01-03: 50 mg via INTRAMUSCULAR
  Filled 2019-01-03: qty 1

## 2019-01-03 MED ORDER — LORAZEPAM 2 MG/ML IJ SOLN
2.0000 mg | Freq: Once | INTRAMUSCULAR | Status: AC
  Administered 2019-01-03: 2 mg via INTRAVENOUS
  Filled 2019-01-03: qty 1

## 2019-01-03 MED ORDER — DIPHENHYDRAMINE HCL 50 MG/ML IJ SOLN
50.0000 mg | Freq: Once | INTRAMUSCULAR | Status: AC
  Administered 2019-01-03: 50 mg via INTRAVENOUS
  Filled 2019-01-03: qty 1

## 2019-01-03 MED ORDER — MIDAZOLAM HCL 2 MG/2ML IJ SOLN
4.0000 mg | Freq: Once | INTRAMUSCULAR | Status: AC
  Administered 2019-01-03: 4 mg via INTRAMUSCULAR
  Filled 2019-01-03: qty 4

## 2019-01-03 MED ORDER — SODIUM CHLORIDE 0.9 % IV BOLUS
1000.0000 mL | Freq: Once | INTRAVENOUS | Status: AC
  Administered 2019-01-03: 1000 mL via INTRAVENOUS

## 2019-01-03 MED ORDER — ESCITALOPRAM OXALATE 10 MG PO TABS
20.0000 mg | ORAL_TABLET | Freq: Every day | ORAL | Status: DC
  Administered 2019-01-03 – 2019-01-04 (×2): 20 mg via ORAL
  Filled 2019-01-03 (×2): qty 2

## 2019-01-03 MED ORDER — LORAZEPAM 2 MG/ML IJ SOLN: 2.0000 mg | Freq: Once | INTRAMUSCULAR | Status: DC
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
## 2019-01-03 NOTE — ED Notes (Signed)
Bed: WA27 Expected date:  Expected time:  Means of arrival:  Comments: Hold for resB

## 2019-01-03 NOTE — ED Notes (Signed)
Pt stated that he wanted to commit suicide.   When asked why? Pt stated if it happens it happens.   Informed Pensions consultant.

## 2019-01-03 NOTE — ED Notes (Signed)
Pt given lunch tray and gingerale

## 2019-01-03 NOTE — ED Notes (Signed)
Family at bedside. 

## 2019-01-03 NOTE — ED Notes (Signed)
Asked for urine  

## 2019-01-03 NOTE — BH Assessment (Addendum)
Assessment Note  Rodney Fernandez is an 38 y.o. male patient that presents this date voluntary after overdosing on amphetamines. Patient is observed to be very drowsy and is difficult to rouse. Patient speaks in a low soft voice and is not oriented to time although states "I am in the hospital." Patient states he cannot recall the incident that occurred earlier this date although renders conflicting history in reference to that event first stating he wanted to "party until I die" and then when asked if he was attempting to take his life stated "no." Patient reports he "took a lot of drugs" although will not elaborate on time frame or substances consumed. Patient's UDS is pending. Patient's mother is at bedside who assists in providing collateral information with patient's permission. Patient's mother reports patient attempted to overdose on 200 Tylenol PM 3 weeks ago and was admitted at [facility] where he was on life support for over three days. Patient was asked several times in reference to current SA issues and declines to answer. Per chart review patient has past reported use of multiple substances to include: cocaine, alcohol, cannabis and methamphetamine. Patient per chart was last seen in September 2016 when he presented on that date for SA issues and thoughts of self harm by cutting. Patient was diagnosed with depression although states he is currently not prescribed any medications to assist with symptom management. Patient reports he resides with his mother and list his current stressor as being his ex partner. Patient denies any H/I or AVH. Patient will not respond to some of the questions associated with assessment and contnues to fall back asleep. Information to complete assessment was obtained from notes and history. Per notes, patient has done methamphetamines off and on but not on a regular basis until January 3 and he is been doing it constantly for 3 days. He also states he is never done IV  drugs and this was the first time he started doing that. He states "I just felt like getting high". He states "I knew the way I wanted to feel some kind of way and knew that this would do it." He states he started feeling bad, he states he felt dizzy and was seeing white flashing lights and things were moving that were not there. He states when it lasted for more than an hour he got scared and he called EMS. He states he was not trying to kill himself. Patient states he has been in recovery from cocaine abuse for 21 months. He states he last attended a meeting last week. He is wearing a rubber bracelet that states "recovery cures". Case was staffed with Shaune Pollack DNP who recommended a inpatient admission to assist with stabilization.    Diagnosis: Substance induced mood disorder    Past Medical History:  Past Medical History:  Diagnosis Date  . Pneumothorax 2014    Past Surgical History:  Procedure Laterality Date  . CLAVICLE SURGERY    . ORTHOPEDIC SURGERY  2014   Metal placement in rght hand and left shoulder    Family History: No family history on file.  Social History:  reports that he has been smoking cigarettes. He has been smoking about 0.75 packs per day. He has never used smokeless tobacco. He reports that he does not drink alcohol or use drugs.  Additional Social History:  Alcohol / Drug Use Pain Medications: See PTA Prescriptions: See PTA Over the Counter: See PTA History of alcohol / drug use?: Yes Longest period of  sobriety (when/how long): 2 years Negative Consequences of Use: (UTA) Withdrawal Symptoms: (UTA) Substance #1 Name of Substance 1: Alcohol per hx 1 - Age of First Use: UTA 1 - Amount (size/oz): UTA 1 - Frequency: UTA 1 - Duration: UTA 1 - Last Use / Amount: UTA BAL pending  Substance #2 Name of Substance 2: Amphetamines per hx 2 - Age of First Use: UTA 2 - Amount (size/oz): UTA 2 - Frequency: UTA 2 - Duration: UTA 2 - Last Use / Amount: UTA UDS  pending  CIWA: CIWA-Ar BP: 94/63 Pulse Rate: 97 Nausea and Vomiting: no nausea and no vomiting Tactile Disturbances: none Tremor: moderate, with patient's arms extended Auditory Disturbances: not present Paroxysmal Sweats: barely perceptible sweating, palms moist Visual Disturbances: not present Anxiety: three Headache, Fullness in Head: severe Agitation: somewhat more than normal activity Orientation and Clouding of Sensorium: oriented and can do serial additions CIWA-Ar Total: 14 COWS: Clinical Opiate Withdrawal Scale (COWS) Resting Pulse Rate: Pulse Rate greater than 120 Sweating: No report of chills or flushing Restlessness: Frequent shifting or extraneous movements of legs/arms Pupil Size: Pupils moderately dilated Bone or Joint Aches: Not present Runny Nose or Tearing: Not present GI Upset: Stomach cramps Tremor: Gross tremor or muscle twitching Yawning: No yawning Anxiety or Irritability: None Gooseflesh Skin: Skin is smooth COWS Total Score: 14  Allergies:  Allergies  Allergen Reactions  . Haloperidol And Related Other (See Comments)    Reaction:  Causes pts joints to lock  . Tylenol [Acetaminophen]     Damaged liver    Home Medications: (Not in a hospital admission)   OB/GYN Status:  No LMP for male patient.  General Assessment Data Location of Assessment: [facility] ED TTS Assessment: In system Is this a Tele or Face-to-Face Assessment?: Face-to-Face Is this an Initial Assessment or a Re-assessment for this encounter?: Initial Assessment Patient Accompanied by:: (Mother Benjaman Pott) Language Other than English: No Living Arrangements: Other (Comment)(Parent) What gender do you identify as?: Male Marital status: Separated Maiden name: NA Pregnancy Status: No Living Arrangements: Parent Can pt return to current living arrangement?: Yes Admission Status: Voluntary Is patient capable of signing voluntary admission?: Yes Referral Source:  Self/Family/Friend Insurance type: Self Pay     Crisis Care Plan Living Arrangements: Parent Legal Guardian: (NA) Name of Psychiatrist: None Name of Therapist: None  Education Status Is patient currently in school?: No Is the patient employed, unemployed or receiving disability?: Employed  Risk to self with the past 6 months Suicidal Ideation: Yes-Currently Present Has patient been a risk to self within the past 6 months prior to admission? : Yes Suicidal Intent: Yes-Currently Present Has patient had any suicidal intent within the past 6 months prior to admission? : Yes Is patient at risk for suicide?: Yes Suicidal Plan?: Yes-Currently Present Has patient had any suicidal plan within the past 6 months prior to admission? : Yes Specify Current Suicidal Plan: Overdose Access to Means: Yes Specify Access to Suicidal Means: Pt has access to multiple substances What has been your use of drugs/alcohol within the last 12 months?: Current use Previous Attempts/Gestures: Yes How many times?: 3 Other Self Harm Risks: (Excessive SA use) Triggers for Past Attempts: Spouse contact Intentional Self Injurious Behavior: Cutting(Per hx) Comment - Self Injurious Behavior: Hx of per chart Family Suicide History: No Recent stressful life event(s): Conflict (Comment)(with ex partner) Persecutory voices/beliefs?: No Depression: Yes Depression Symptoms: Feeling worthless/self pity Substance abuse history and/or treatment for substance abuse?: No Suicide prevention information given  to non-admitted patients: Not applicable  Risk to Others within the past 6 months Homicidal Ideation: No Does patient have any lifetime risk of violence toward others beyond the six months prior to admission? : No Thoughts of Harm to Others: No Current Homicidal Intent: No Current Homicidal Plan: No Access to Homicidal Means: No Identified Victim: NA History of harm to others?: No Assessment of Violence: None  Noted Violent Behavior Description: NA Does patient have access to weapons?: No Criminal Charges Pending?: No Does patient have a court date: No Is patient on probation?: No  Psychosis Hallucinations: None noted Delusions: None noted  Mental Status Report Appearance/Hygiene: In scrubs Eye Contact: Poor Motor Activity: Unsteady Speech: Slow, Slurred Level of Consciousness: Drowsy Mood: Depressed Affect: Flat Anxiety Level: Minimal Thought Processes: Unable to Assess Judgement: Unable to Assess Orientation: Unable to assess Obsessive Compulsive Thoughts/Behaviors: None  Cognitive Functioning Concentration: Unable to Assess Memory: Unable to Assess Is patient IDD: No Insight: Unable to Assess Impulse Control: Unable to Assess Appetite: (UTA) Have you had any weight changes? : (UTA) Sleep: (UTA) Total Hours of Sleep: (UTA) Vegetative Symptoms: None  ADLScreening [facility] Assessment Services) Patient's cognitive ability adequate to safely complete daily activities?: Yes Patient able to express need for assistance with ADLs?: Yes Independently performs ADLs?: Yes (appropriate for developmental age)  Prior Inpatient Therapy Prior Inpatient Therapy: Yes Prior Therapy Dates: 2019 [facility] Reason for Treatment: Overdose  Prior Outpatient Therapy Prior Outpatient Therapy: No Does patient have an ACCT team?: No Does patient have Intensive In-House Services?  : No Does patient have [facility] services? : No Does patient have P4CC services?: No  ADL Screening (condition at time of admission) Patient's cognitive ability adequate to safely complete daily activities?: Yes Is the patient deaf or have difficulty hearing?: No Does the patient have difficulty seeing, even when wearing glasses/contacts?: No Does the patient have difficulty concentrating, remembering, or making decisions?: No Patient able to express need for assistance with  ADLs?: Yes Does the patient have difficulty dressing or bathing?: No Independently performs ADLs?: Yes (appropriate for developmental age) Does the patient have difficulty walking or climbing stairs?: No Weakness of Legs: None Weakness of Arms/Hands: None  Home Assistive Devices/Equipment Home Assistive Devices/Equipment: None  Therapy Consults (therapy consults require a physician order) PT Evaluation Needed: No OT Evalulation Needed: No SLP Evaluation Needed: No Abuse/Neglect Assessment (Assessment to be complete while patient is alone) Physical Abuse: Denies Verbal Abuse: Denies Sexual Abuse: Denies Exploitation of patient/patient's resources: Denies Self-Neglect: Denies Values / Beliefs Cultural Requests During Hospitalization: None Spiritual Requests During Hospitalization: None Consults Spiritual Care Consult Needed: No Social Work Consult Needed: No Merchant navy officerAdvance Directives (For Healthcare) Does Patient Have a Medical Advance Directive?: No Would patient like information on creating a medical advance directive?: No - Patient declined          Disposition: Case was staffed with Shaune PollackLord DNP who recommended a inpatient admission to assist with stabilization.   Disposition Initial Assessment Completed for this Encounter: Yes Disposition of Patient: Admit Type of inpatient treatment program: Adult Patient refused recommended treatment: No Mode of transportation if patient is discharged/movement?: (Unk)  On Site Evaluation by:   Reviewed with Physician:    Alfredia Fergusonavid L Novalynn Branaman 01/03/2019 3:35 PM

## 2019-01-03 NOTE — BH Assessment (Signed)
[facility] Assessment Progress Note Case was staffed with Lord DNP who recommended a inpatient admission to assist with stabilization.       

## 2019-01-03 NOTE — ED Notes (Addendum)
Pt agitation escalating, continuously trying to get out of bed.  Reorientation attempts unsuccessful, pt verbally threatening this RN, "Dont f**cking touch me, I need to leave."  Sitter at bedside, unable to help calm pt. Security called to bedside due to pt removing own IV, trying to hurdle bed rails, attempting to pull cords off vital machine monitor.  MD made aware of pt elevated heart rate, escalating agitation.

## 2019-01-03 NOTE — ED Provider Notes (Signed)
[facility]-EMERGENCY DEPT Provider Note   CSN: [id number] Arrival date & time: 01/03/19  0502  Time seen 5:52 AM   History   Chief Complaint Chief Complaint  Patient presents with  . Drug Overdose    meth    HPI Rodney Fernandez is a 38 y.o. male.  HPI patient states he has done methamphetamines off and on but not on a regular basis until January 3 and he is been doing it constantly for 3 days.  He also states he is never done IV drugs and this was the first time he started doing that.  He states "I just felt like getting high".  He states "I knew the way I wanted to feel some kind of way and knew that this would do it."  He states he started feeling bad, he states he felt dizzy and was seeing white flashing lights and things were moving that were not there.  He states when it lasted for more than an hour he got scared and he called EMS.  He states he was not trying to kill himself.  Patient states he has been in recovery from cocaine abuse for 21 months.  He states he last attended a meeting last week.  He is wearing a rubber bracelet that states "recovery cures".  PCP Patient, No Pcp Per   Past Medical History:  Diagnosis Date  . Pneumothorax 2014    Patient Active Problem List   Diagnosis Date Noted  . Tylenol overdose 08/01/2017  . Alcohol abuse 08/01/2017  . Substance induced mood disorder (HCC) 08/01/2017  . Polysubstance dependence (HCC) 08/01/2017  . Suicide attempt (HCC)   . Cocaine use disorder, moderate, dependence (HCC) 09/07/2015  . Severe recurrent major depression without psychotic features (HCC) 09/01/2015  . Alcohol use disorder, severe, dependence (HCC) 09/01/2015  . Severe alcohol use disorder (HCC) 09/01/2015    Past Surgical History:  Procedure Laterality Date  . CLAVICLE SURGERY    . ORTHOPEDIC SURGERY  2014   Metal placement in rght hand and left shoulder        Home Medications    Prior to Admission medications     Medication Sig Start Date End Date Taking? Authorizing Provider  escitalopram (LEXAPRO) 20 MG tablet Take 1 tablet (20 mg total) by mouth daily. For depression 08/04/17   Catarina Hartshorn, MD    Family History No family history on file.  Social History Social History   Tobacco Use  . Smoking status: Current Every Day Smoker    Packs/day: 0.75    Types: Cigarettes  . Smokeless tobacco: Never Used  Substance Use Topics  . Alcohol use: No  . Drug use: No    Types: Cocaine, Marijuana    Comment: Denies (01/01/17)  Employed in [employer]   Allergies   Haloperidol and related   Review of Systems Review of Systems  All other systems reviewed and are negative.    Physical Exam Updated Vital Signs BP 100/79   Pulse (!) 120   Temp 98.1 F (36.7 C) (Oral)   Resp (!) 21   SpO2 95%   Vital signs normal except tachycardia   Physical Exam Vitals signs and nursing note reviewed.  Constitutional:      General: He is in acute distress.     Appearance: Normal appearance. He is well-developed. He is not ill-appearing or toxic-appearing.     Comments: Small frame Patient is jerking and twitching  HENT:     Head:  Normocephalic and atraumatic.     Right Ear: External ear normal.     Left Ear: External ear normal.     Nose: Nose normal. No mucosal edema or rhinorrhea.     Mouth/Throat:     Dentition: No dental abscesses.     Pharynx: No uvula swelling.  Eyes:     Conjunctiva/sclera: Conjunctivae normal.     Pupils: Pupils are equal, round, and reactive to light.  Neck:     Musculoskeletal: Full passive range of motion without pain, normal range of motion and neck supple.  Cardiovascular:     Rate and Rhythm: Regular rhythm. Tachycardia present.     Heart sounds: Normal heart sounds. No murmur. No friction rub. No gallop.   Pulmonary:     Effort: Pulmonary effort is normal. No respiratory distress.     Breath sounds: Normal breath sounds. No wheezing, rhonchi or rales.  Chest:      Chest wall: No tenderness or crepitus.  Abdominal:     General: Bowel sounds are normal. There is no distension.     Palpations: Abdomen is soft.     Tenderness: There is no abdominal tenderness. There is no guarding or rebound.  Musculoskeletal: Normal range of motion.        General: No tenderness.     Comments: Moves all extremities well.   Skin:    General: Skin is warm and dry.     Coloration: Skin is not pale.     Findings: No erythema or rash.     Comments: I examined his arms, he has a long line of needle tracks in his left upper medial arm, there is some bruising but no evidence of infection.  He has a couple of marks in his right upper arm however he has much less needle marks there and they also appear to be without infection.  Patient is right-handed.  Neurological:     General: No focal deficit present.     Mental Status: He is alert and oriented to person, place, and time.     Cranial Nerves: No cranial nerve deficit.  Psychiatric:        Mood and Affect: Mood normal. Mood is not anxious.        Speech: Speech normal.        Behavior: Behavior normal.        Thought Content: Thought content normal.      ED Treatments / Results  Labs (all labs ordered are listed, but only abnormal results are displayed) Results for orders placed or performed during the hospital encounter of 01/03/19  CBC with Differential  Result Value Ref Range   WBC 7.9 4.0 - 10.5 K/uL   RBC 4.82 4.22 - 5.81 MIL/uL   Hemoglobin 15.1 13.0 - 17.0 g/dL   HCT 40.944.6 81.139.0 - 91.452.0 %   MCV 92.5 80.0 - 100.0 fL   MCH 31.3 26.0 - 34.0 pg   MCHC 33.9 30.0 - 36.0 g/dL   RDW 78.212.3 95.611.5 - 21.315.5 %   Platelets 163 150 - 400 K/uL   nRBC 0.0 0.0 - 0.2 %   Neutrophils Relative % 74 %   Neutro Abs 5.8 1.7 - 7.7 K/uL   Lymphocytes Relative 15 %   Lymphs Abs 1.2 0.7 - 4.0 K/uL   Monocytes Relative 11 %   Monocytes Absolute 0.8 0.1 - 1.0 K/uL   Eosinophils Relative 0 %   Eosinophils Absolute 0.0 0.0 - 0.5  K/uL  Basophils Relative 0 %   Basophils Absolute 0.0 0.0 - 0.1 K/uL   Immature Granulocytes 0 %   Abs Immature Granulocytes 0.02 0.00 - 0.07 K/uL  Basic metabolic panel  Result Value Ref Range   Sodium 135 135 - 145 mmol/L   Potassium 3.6 3.5 - 5.1 mmol/L   Chloride 100 98 - 111 mmol/L   CO2 22 22 - 32 mmol/L   Glucose, Bld 94 70 - 99 mg/dL   BUN 17 6 - 20 mg/dL   Creatinine, Ser 3.55 0.61 - 1.24 mg/dL   Calcium 9.4 8.9 - 73.2 mg/dL   GFR calc non Af Amer >60 >60 mL/min   GFR calc Af Amer >60 >60 mL/min   Anion gap 13 5 - 15  Ethanol  Result Value Ref Range   Alcohol, Ethyl (B) <10 <10 mg/dL  Acetaminophen level  Result Value Ref Range   Acetaminophen (Tylenol), Serum <10 (L) 10 - 30 ug/mL  Salicylate level  Result Value Ref Range   Salicylate Lvl <7.0 2.8 - 30.0 mg/dL   Laboratory interpretation all normal except UDS and CK pending    EKG EKG Interpretation  Date/Time:  Monday January 03 2019 05:16:33 EST Ventricular Rate:  118 PR Interval:    QRS Duration: 113 QT Interval:  340 QTC Calculation: 477 R Axis:   84 Text Interpretation:  Sinus tachycardia Incomplete right bundle branch block Borderline prolonged QT interval Since last tracing rate faster 01 Aug 2017 Confirmed by Devoria Albe ([id number]) on 01/03/2019 5:36:56 AM   Radiology No results found.  Procedures Procedures (including critical care time)  Medications Ordered in ED Medications  LORazepam (ATIVAN) injection 2 mg (has no administration in time range)  diphenhydrAMINE (BENADRYL) injection 50 mg (has no administration in time range)  sodium chloride 0.9 % bolus 1,000 mL (has no administration in time range)  LORazepam (ATIVAN) injection 2 mg (2 mg Intravenous Given 01/03/19 0641)     Initial Impression / Assessment and Plan / ED Course  I have reviewed the triage vital signs and the nursing notes.  Pertinent labs & imaging results that were available during my care of the patient were reviewed by  me and considered in my medical decision making (see chart for details).     Patient had received 2.5 mg of Versed by EMS.  He was given 2 mg of Ativan.  Recheck at 7 AM patient is still jerking and twitching.  He states he is feeling better however.  He was then given additional 2 mg Ativan plus Benadryl 50 mg IV.  Haldol has been recommended however he has an allergy.  Patient was given IV fluids in case he does have rhabdomyolysis.  7:24 AM patient turned over to Dr. Adela Lank to finish his treatment and disposition.  Final Clinical Impressions(s) / ED Diagnoses   Final diagnoses:  Methamphetamine abuse (HCC)    Disposition pending  Devoria Albe, MD, Concha Pyo, MD 01/03/19 [phone]

## 2019-01-03 NOTE — ED Notes (Signed)
Bed: RESB Expected date:  Expected time:  Means of arrival:  Comments: 31M meth OD 2.5 versed given

## 2019-01-03 NOTE — ED Notes (Signed)
Pt resting comfortably. Sitter has patient in sight.

## 2019-01-03 NOTE — ED Notes (Signed)
Pt asleep, sitter at bedside, VS WNL.

## 2019-01-03 NOTE — ED Notes (Signed)
Pt verbalizing want to "be over and done with" when asked for specific plan, pt responded "yea, it'll take a few months but Im working on it."  Pt fidgety in bed, attempting to get out of bed continuously, observed to be unsteady when standing.  Pt reoriented to situation, asked to lay back in bed. Pt unable to speak full sentences.

## 2019-01-03 NOTE — ED Provider Notes (Signed)
I received the patient in signout from Dr. Lynelle Doctor, briefly the patient is a 38 year old male who overdosed on methamphetamines.  The patient was addicted to cocaine and recently went to rehab and decided that he needed something and so chose methamphetamines last night.  The plan is to reassess him after giving another dose of Ativan and a bolus of IV fluids.  The patient unfortunately seems to have worsened he pulled his IV out and is hallucinating actively on my reassessment.  He is currently a danger to himself and others and I will chemically sedate him.  The patient was observed in the ED for some time he is somewhat more arousable currently.  TTS evaluation.   Melene Plan, DO 01/03/19 1417

## 2019-01-04 DIAGNOSIS — F1994 Other psychoactive substance use, unspecified with psychoactive substance-induced mood disorder: Secondary | ICD-10-CM

## 2019-01-04 DIAGNOSIS — F1510 Other stimulant abuse, uncomplicated: Secondary | ICD-10-CM

## 2019-01-04 DIAGNOSIS — F1910 Other psychoactive substance abuse, uncomplicated: Secondary | ICD-10-CM | POA: Diagnosis present
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
## 2019-01-04 NOTE — BH Assessment (Signed)
[facility] Assessment Progress Note  Per Jacqueline Norman, DO, this pt does not require psychiatric hospitalization at this time.  Pt is to be discharged from [facility] with referral information for area substance abuse treatment providers.  This has been included in pt's discharge instructions.  Pt would also benefit from seeing Peer Support Specialists; they will be asked to speak to pt.  Pt's nurse, Cynthia, has been notified.  Rodney Fuhrman, MA Triage Specialist [phone]     

## 2019-01-04 NOTE — Patient Outreach (Signed)
Patient is interested in getting into long-term residential substance use treatment. Patient talked to intake department at [facility], a two year residential substance use treatment facility in [place], [place]. Patient has his intake interview set for tomorrow 01/05/18 at 10:30 am. Patient plans to attend NA meetings after discharge from the [facility]. Patient is currently on probation and takes part in the Drug Court system in [place], [place]. [facility] provided the patient with information for [facility], residential/outpatient substance use treatment center list, and [facility]PSS contact information. CPSS strongly encouraged the patient to continue to stay in contact with [facility] for further substance use recovery support with lived experience from [facility]PSS after discharge from the [facility]. CPSS also informed the patient to contact [facility] if he needs further help with getting connected to substance use treatment resources after discharge from the [facility].

## 2019-01-04 NOTE — Discharge Instructions (Signed)
To help you maintain a sober lifestyle, a substance abuse treatment program may be beneficial to you.  Contact one of the following facilities at your earliest opportunity to ask about enrolling: ° °RESIDENTIAL PROGRAMS: ° °     [facility] °     [address] °     [place], [place] [address] °     [phone] ° °     [facility] °     [address] °     [place], [place] [address] °     [phone] ° °     [facility] °     [address] °     , [place] [address] °     [phone] ° °OUTPATIENT PROGRAMS: ° °     [facility] °     [address] °     [place], [place] [address] °     [phone] °     New patients are seen at the walk-in clinic every Tuesday from 9:00 am - 12:00 pm °

## 2019-01-04 NOTE — Patient Outreach (Signed)
ED Peer Support Specialist Patient Intake (Complete at intake & 30-60 Day Follow-up)  Name: Rodney Fernandez  MRN: [id number]  Age: 38 y.o.   Date of Admission: 01/04/2019  Intake: Initial Comments:      Primary Reason Admitted: Substance abuse.   Lab values: Alcohol/ETOH:   Positive UDS? Yes Amphetamines: Yes Barbiturates: No Benzodiazepines: Yes Cocaine: Yes Opiates: No Cannabinoids: No  Demographic information: Gender: Male Ethnicity: White Marital Status: Divorced Insurance Status: Uninsured/Self-pay Ecologist (Work Neurosurgeon, Physicist, medical, etc.: No Lives with: Alone Living situation: House/Apartment  Reported Patient History: Patient reported health conditions: None Patient aware of HIV and hepatitis status: No  In past year, has patient visited ED for any reason? No  Number of ED visits:    Reason(s) for visit:    In past year, has patient been hospitalized for any reason? No  Number of hospitalizations:    Reason(s) for hospitalization:    In past year, has patient been arrested? No  Number of arrests:    Reason(s) for arrest:    In past year, has patient been incarcerated? No  Number of incarcerations:    Reason(s) for incarceration:    In past year, has patient received medication-assisted treatment? No  In past year, patient received the following treatments:    In past year, has patient received any harm reduction services? No  Did this include any of the following?    In past year, has patient received care from a mental health provider for diagnosis other than SUD? No  In past year, is this first time patient has overdosed? Yes  Number of past overdoses: (2)  In past year, is this first time patient has been hospitalized for an overdose? No  Number of hospitalizations for overdose(s):    Is patient currently receiving treatment for a mental health diagnosis? No  Patient reports experiencing difficulty  participating in SUD treatment: No    Most important reason(s) for this difficulty?    Has patient received prior services for treatment? No  In past, patient has received services from following agencies:    Plan of Care:  Suggested follow up at these agencies/treatment centers: (Pt stated that he wants to seek a long term facility to help better the quality of his life. )  Other information: CPSS met with Pt and was able to gain information to better assist Pt in the community. Pt talked with CPSS Jenny Reichmann an I about the concerns that he was having and what led to him having to visit with the ER. CPSS was made aware that Pt was in drug court but wants to leave to seek help in a facility in [place]. Pt signed a consent form for [facility] to speak with Probation officer to see if it will be alright if he can himself into a substance abuse facility.    Aaron Edelman Reed Eifert, [place]  01/04/2019 12:20 PM

## 2019-01-05 ENCOUNTER — Encounter: Payer: Self-pay | Admitting: Pediatric Intensive Care

## 2019-01-05 NOTE — Congregational Nurse Program (Signed)
  Dept: [phone]   Congregational Nurse Program Note  Date of Encounter: 01/05/2019  Past Medical History: Past Medical History:  Diagnosis Date  . Pneumothorax 2014    Encounter Details: New client encounter. Client referred by Dorothy Puffer- drug treatment case manager at [facility]. Client has bed at [facility] and needs 30 days of medication in order to receive treatment. Client referred to [facility] clinic. CN assissted client with medical intake. Client consents to CN sharing PHI with MA Placey NNP, Kia HInes. Client also consents to CN access to medical records in Epic. CN advises client follow up in clinic after discharge from [facility]. Client agrees to plan.  Shann Medal RN BSN CNP [phone]
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
## 2024-10-25 ENCOUNTER — Other Ambulatory Visit: Payer: Self-pay | Admitting: General Surgery

## 2024-10-25 ENCOUNTER — Ambulatory Visit (HOSPITAL_COMMUNITY): Admission: RE | Admit: 2024-10-25 | Source: Ambulatory Visit

## 2024-10-25 ENCOUNTER — Encounter: Payer: Self-pay | Admitting: General Surgery

## 2024-10-25 ENCOUNTER — Ambulatory Visit (HOSPITAL_COMMUNITY)
Admission: RE | Admit: 2024-10-25 | Discharge: 2024-10-25 | Disposition: A | Discharge status: Home / Self Care | Source: Ambulatory Visit | Attending: General Surgery | Admitting: General Surgery

## 2024-10-25 ENCOUNTER — Ambulatory Visit (INDEPENDENT_AMBULATORY_CARE_PROVIDER_SITE_OTHER): Payer: Self-pay | Admitting: General Surgery

## 2024-10-25 VITALS — BP 134/94 | HR 98 | Temp 98.2°F | Resp 16 | Ht 72.0 in | Wt 161.0 lb

## 2024-10-25 DIAGNOSIS — N5089 Other specified disorders of the male genital organs: Secondary | ICD-10-CM

## 2024-10-25 DIAGNOSIS — K5900 Constipation, unspecified: Secondary | ICD-10-CM | POA: Diagnosis not present

## 2024-10-25 DIAGNOSIS — K625 Hemorrhage of anus and rectum: Secondary | ICD-10-CM | POA: Insufficient documentation

## 2024-10-25 DIAGNOSIS — N5082 Scrotal pain: Secondary | ICD-10-CM | POA: Diagnosis not present

## 2024-10-25 NOTE — Progress Notes (Unsigned)
 [facility] History and Physical  Reason for Referral:*** Referring Physician: ***  Chief Complaint   New Patient (Initial Visit)     Rodney Fernandez is a 43 y.o. male.  HPI:   Discussed the use of AI scribe software for clinical note transcription with the patient, who gave verbal consent to proceed.  History of Present Illness      ***.  The *** started *** and has had a duration of ***.  It is associated with ***.  The *** is improved with ***, and is made worse with ***.    Quality*** Context***  Past Medical History:  Diagnosis Date   Pneumothorax 2014    Past Surgical History:  Procedure Laterality Date   CLAVICLE SURGERY     ORTHOPEDIC SURGERY  2014   Metal placement in rght hand and left shoulder    No family history on file.  Social History   Tobacco Use   Smoking status: Every Day    Current packs/day: 0.75    Types: Cigarettes   Smokeless tobacco: Never  Substance Use Topics   Alcohol use: No   Drug use: No    Types: Cocaine, Marijuana    Comment: Denies (01/01/17)    Medications: {medication reviewed/display:3041432} Allergies as of 10/25/2024       Reactions   Haloperidol And Related Other (See Comments)   Reaction:  Causes pts joints to lock   Tylenol  [acetaminophen ]    Damaged liver        Medication List        Accurate as of October 25, 2024 10:55 AM. If you have any questions, ask your nurse or doctor.          STOP taking these medications    escitalopram  20 MG tablet Commonly known as: LEXAPRO  Stopped by: Manuelita JAYSON Pander         ROS:  {Review of Systems:30496}  Blood pressure (!) 134/94, pulse 98, temperature 98.2 F (36.8 C), temperature source Oral, resp. rate 16, height 6' (1.829 m), weight 161 lb (73 kg), SpO2 96%. Physical Exam Physical Exam   Results: No results found for this or any previous visit (from the past 48 hours).  No results found.   Assessment and  Plan: Assessment and Plan Assessment & Plan      Rodney Fernandez is a 43 y.o. male with *** -*** -*** -Follow up ***  All questions were answered to the satisfaction of the patient and family***.  The risk and benefits of *** were discussed including but not limited to ***.  After careful consideration, Rodney Fernandez has decided to ***.    Manuelita JAYSON Pander 10/25/2024, 10:55 AM

## 2024-10-25 NOTE — Patient Instructions (Addendum)
 Will get patient referred to get an US  of the scrotum as this is a scrotal mass with no signs of an inguinal hernia on the exam. His pain and issues is directly on top of the scrotum more consistent with a primary testicular or epididymitis type picture. Since this has been going on for weeks a torsion issues is unlikely and would be out of the window for any intervention.   Will also get urine sent to assess for any bacteria in the urine.   As far as the constipation, this is likely related to the recent diet and meat you are eating in the jail. I recommend metamucil/ benefiber daily for constipation and miralax as needed.  If you continue to have bleeding from the rectum, they will need to refer you to GI for further evaluation. No masses or lesions were noted on the anal exam today.

## 2024-10-26 ENCOUNTER — Ambulatory Visit (INDEPENDENT_AMBULATORY_CARE_PROVIDER_SITE_OTHER): Payer: Self-pay | Admitting: General Surgery

## 2024-10-26 DIAGNOSIS — N5089 Other specified disorders of the male genital organs: Secondary | ICD-10-CM

## 2024-10-26 NOTE — Progress Notes (Signed)
 Concern for epididymal cyst versus hydrocele. I would send him to Urology. I do not see his urine tests back yet.

## 2024-10-28 ENCOUNTER — Ambulatory Visit: Admitting: Urology

## 2024-10-28 ENCOUNTER — Encounter: Payer: Self-pay | Admitting: Urology

## 2024-10-28 VITALS — BP 116/79 | HR 82

## 2024-10-28 DIAGNOSIS — N5089 Other specified disorders of the male genital organs: Secondary | ICD-10-CM

## 2024-10-28 DIAGNOSIS — N503 Cyst of epididymis: Secondary | ICD-10-CM

## 2024-10-28 LAB — URINALYSIS, ROUTINE W REFLEX MICROSCOPIC
Bilirubin, UA: NEGATIVE
Glucose, UA: NEGATIVE
Ketones, UA: NEGATIVE
Leukocytes,UA: NEGATIVE
Nitrite, UA: NEGATIVE
Protein,UA: NEGATIVE
RBC, UA: NEGATIVE
Specific Gravity, UA: 1.03 (ref 1.005–1.030)
Urobilinogen, Ur: 0.2 mg/dL (ref 0.2–1.0)
pH, UA: 6 (ref 5.0–7.5)

## 2024-10-28 NOTE — Addendum Note (Signed)
 Addended by: Kyndell Zeiser L on: 10/28/2024 10:12 AM   Modules accepted: Orders

## 2024-10-28 NOTE — Patient Instructions (Signed)
Spermatocele  A spermatocele is a fluid-filled sac (cyst) inside the sac that holds the testicles (scrotum). This type of cyst often forms in the epididymis. The epididymis is a coiled tube at the top of each testicle, and this tube is where sperm are stored. The cyst sometimes forms along a tube called the vas deferens, which is a tube that carries sperm away from the epididymis. Spermatoceles are usually painless. Most cysts are small, but they can grow larger. Spermatoceles are not cancerous (are benign). What are the causes? The cause of this condition is not known. However, this condition usually results from a blockage in one of the many small tubes (tubules) that carry sperm from your testicle to your vas deferens. What are the signs or symptoms? In most cases, small cysts do not cause symptoms. However, symptoms sometimes occur. Symptoms of this condition include: Dull pain. A feeling of heaviness. An enlarged scrotum, if your cyst is large. How is this diagnosed? This condition is diagnosed based on a physical exam. You or your health care provider may notice your cyst when feeling your scrotum. Your health care provider may shine a light through (transilluminate) your scrotum to see if light will pass through your cyst. You may have an ultrasound of the scrotum to rule out a tumor. How is this treated? Small spermatoceles do not need to be treated. If your spermatocele has grown large or is uncomfortable, your health care provider may recommend surgery to remove it. Follow these instructions at home: Check your spermatocele regularly for any changes. Do regular self-exams of your scrotum. Keep all follow-up visits. This is important. Contact a health care provider if: Your spermatocele gets larger. You have pain in your scrotum. Your spermatocele comes back after treatment. Get help right away if: You experience severe pain and redness of your scrotum. Summary A spermatocele  is a fluid-filled sac, or a cyst, inside the sac that holds the testicles (scrotum). This condition is usually painless, and it is not cancerous (is benign). Your health care provider may recommend surgery to remove your spermatocele if it grows large or is uncomfortable. If you have a spermatocele, check for any changes and do self-exams of your scrotum. Keep all follow-up visits. This is important. This information is not intended to replace advice given to you by your health care provider. Make sure you discuss any questions you have with your health care provider. Document Revised: 08/05/2021 Document Reviewed: 08/05/2021 Elsevier Patient Education  2024 [employer] Inc.  

## 2024-10-28 NOTE — Progress Notes (Signed)
 10/28/2024 8:48 AM   Rodney Fernandez May 19, 1981 [id number]  Referring provider: Kallie Manuelita BROCKS, MD [address] [place],  [place] [address]  LEFT SCROTAL SWELLING   HPI: Rodney Fernandez is a 43yo here for evaluation of left scrotal swelling. For the past 4-5 months he noted increased left scrotal swelling. No testicular injury but he did note bruising to the scrotum when the swelling started. He has pain with ambulation. No significant LUTS. No hx of vasectomy.    PMH: Past Medical History:  Diagnosis Date   Pneumothorax 2014    Surgical History: Past Surgical History:  Procedure Laterality Date   CLAVICLE SURGERY     ORTHOPEDIC SURGERY  2014   Metal placement in rght hand and left shoulder    Home Medications:  Allergies as of 10/28/2024       Reactions   Haloperidol And Related Other (See Comments)   Reaction:  Causes pts joints to lock   Tylenol  [acetaminophen ]    Damaged liver        Medication List        Accurate as of October 28, 2024  8:48 AM. If you have any questions, ask your nurse or doctor.          GoodSense Ibuprofen  200 MG tablet Generic drug: ibuprofen  Take 400 mg by mouth 4 (four) times daily.   GoodSense Pain Relief Extra St 500 MG tablet Generic drug: acetaminophen  Take by mouth.        Allergies:  Allergies  Allergen Reactions   Haloperidol And Related Other (See Comments)    Reaction:  Causes pts joints to lock   Tylenol  [Acetaminophen ]     Damaged liver    Family History: No family history on file.  Social History:  reports that he has been smoking cigarettes. He has never used smokeless tobacco. He reports that he does not drink alcohol and does not use drugs.  ROS: All other review of systems were reviewed and are negative except what is noted above in HPI  Physical Exam: BP 116/79   Pulse 82   Constitutional:  Alert and oriented, No acute distress. HEENT: [place] AT, moist mucus membranes.  Trachea midline, no  masses. Cardiovascular: No clubbing, cyanosis, or edema. Respiratory: Normal respiratory effort, no increased work of breathing. GI: Abdomen is soft, nontender, nondistended, no abdominal masses GU: No CVA tenderness. Circumcised phallus. No masses/lesions on penis, testis, scrotum. 6cm left epididymal head cyst Lymph: No cervical or inguinal lymphadenopathy. Skin: No rashes, bruises or suspicious lesions. Neurologic: Grossly intact, no focal deficits, moving all 4 extremities. Psychiatric: Normal mood and affect.  Laboratory Data: Lab Results  Component Value Date   WBC 7.9 01/03/2019   HGB 15.1 01/03/2019   HCT 44.6 01/03/2019   MCV 92.5 01/03/2019   PLT 163 01/03/2019    Lab Results  Component Value Date   CREATININE 1.19 01/03/2019    No results found for: PSA  No results found for: TESTOSTERONE  Lab Results  Component Value Date   HGBA1C 5.2 08/01/2017    Urinalysis    Component Value Date/Time   COLORURINE YELLOW 01/03/2019 1952   APPEARANCEUR CLEAR 01/03/2019 1952   LABSPEC 1.014 01/03/2019 1952   PHURINE 6.0 01/03/2019 1952   GLUCOSEU NEGATIVE 01/03/2019 1952   HGBUR NEGATIVE 01/03/2019 1952   BILIRUBINUR NEGATIVE 01/03/2019 1952   KETONESUR 5 (A) 01/03/2019 1952   PROTEINUR NEGATIVE 01/03/2019 1952   NITRITE NEGATIVE 01/03/2019 1952   LEUKOCYTESUR NEGATIVE 01/03/2019 1952  No results found for: LABMICR, WBCUA, RBCUA, LABEPIT, MUCUS, BACTERIA  Pertinent Imaging: Scortal US  10/25/2024: Images reviewed and discussed with the patient  No results found for this or any previous visit.  No results found for this or any previous visit.  No results found for this or any previous visit.  No results found for this or any previous visit.  No results found for this or any previous visit.  No results found for this or any previous visit.  No results found for this or any previous visit.  No results found for this or any previous  visit.   Assessment & Plan:    1. Scrotal mass (Primary) The risks/benefits/alternatives to left epididymal cyst excision was explained to the patient and he understands and wishes to proceed with surgery - Urinalysis, Routine w reflex microscopic   No follow-ups on file.  Rodney Clara, MD  [facility] [place]

## 2024-11-01 ENCOUNTER — Ambulatory Visit (HOSPITAL_COMMUNITY): Admission: RE | Admit: 2024-11-01 | Source: Ambulatory Visit

## 2024-12-07 ENCOUNTER — Encounter (HOSPITAL_COMMUNITY): Admission: RE | Admit: 2024-12-07 | Source: Ambulatory Visit

## 2024-12-12 ENCOUNTER — Ambulatory Visit (HOSPITAL_COMMUNITY): Admission: RE | Admit: 2024-12-12 | Admitting: Urology

## 2024-12-12 ENCOUNTER — Encounter (HOSPITAL_COMMUNITY): Admission: RE | Payer: Self-pay | Source: Home / Self Care

## 2024-12-12 SURGERY — EXCISION, SPERMATOCELE
Anesthesia: General | Laterality: Left

## 2025-01-04 ENCOUNTER — Encounter: Admitting: Urology
